# Patient Record
Sex: Female | Born: 1990 | Race: Black or African American | Hispanic: No | Marital: Married
Health system: Southern US, Community
[De-identification: ages and names within clinical notes are randomized; demographics above are authoritative.]

## PROBLEM LIST (undated history)

## (undated) ENCOUNTER — Inpatient Hospital Stay (HOSPITAL_COMMUNITY): Payer: Self-pay

## (undated) DIAGNOSIS — Z789 Other specified health status: Secondary | ICD-10-CM

## (undated) HISTORY — PX: NO PAST SURGERIES: SHX2092

---

## 2015-07-06 ENCOUNTER — Inpatient Hospital Stay (HOSPITAL_COMMUNITY): Payer: Self-pay

## 2015-07-06 ENCOUNTER — Encounter (HOSPITAL_COMMUNITY): Payer: Self-pay

## 2015-07-06 ENCOUNTER — Inpatient Hospital Stay (HOSPITAL_COMMUNITY)
Admission: AD | Admit: 2015-07-06 | Discharge: 2015-07-07 | Disposition: A | Discharge status: Home / Self Care | Payer: Self-pay | Source: Ambulatory Visit | Attending: Obstetrics and Gynecology | Admitting: Obstetrics and Gynecology

## 2015-07-06 DIAGNOSIS — Z3A15 15 weeks gestation of pregnancy: Secondary | ICD-10-CM | POA: Insufficient documentation

## 2015-07-06 DIAGNOSIS — O4692 Antepartum hemorrhage, unspecified, second trimester: Secondary | ICD-10-CM

## 2015-07-06 DIAGNOSIS — R102 Pelvic and perineal pain: Secondary | ICD-10-CM | POA: Insufficient documentation

## 2015-07-06 DIAGNOSIS — O039 Complete or unspecified spontaneous abortion without complication: Secondary | ICD-10-CM

## 2015-07-06 DIAGNOSIS — O9989 Other specified diseases and conditions complicating pregnancy, childbirth and the puerperium: Secondary | ICD-10-CM | POA: Insufficient documentation

## 2015-07-06 DIAGNOSIS — O209 Hemorrhage in early pregnancy, unspecified: Secondary | ICD-10-CM | POA: Insufficient documentation

## 2015-07-06 DIAGNOSIS — N939 Abnormal uterine and vaginal bleeding, unspecified: Secondary | ICD-10-CM

## 2015-07-06 LAB — URINE MICROSCOPIC-ADD ON: WBC UA: NONE SEEN WBC/hpf (ref 0–5)

## 2015-07-06 LAB — URINALYSIS, ROUTINE W REFLEX MICROSCOPIC
BILIRUBIN URINE: NEGATIVE
Glucose, UA: NEGATIVE mg/dL
Ketones, ur: NEGATIVE mg/dL
Leukocytes, UA: NEGATIVE
Nitrite: NEGATIVE
PH: 6.5 (ref 5.0–8.0)
Protein, ur: NEGATIVE mg/dL
SPECIFIC GRAVITY, URINE: 1.02 (ref 1.005–1.030)

## 2015-07-06 LAB — CBC
HCT: 36.6 % (ref 36.0–46.0)
Hemoglobin: 12.6 g/dL (ref 12.0–15.0)
MCH: 29.2 pg (ref 26.0–34.0)
MCHC: 34.4 g/dL (ref 30.0–36.0)
MCV: 84.9 fL (ref 78.0–100.0)
PLATELETS: 324 10*3/uL (ref 150–400)
RBC: 4.31 MIL/uL (ref 3.87–5.11)
RDW: 13.5 % (ref 11.5–15.5)
WBC: 10.8 10*3/uL — ABNORMAL HIGH (ref 4.0–10.5)

## 2015-07-06 LAB — POCT PREGNANCY, URINE: PREG TEST UR: POSITIVE — AB

## 2015-07-06 MED ORDER — IBUPROFEN 600 MG PO TABS
600.0000 mg | ORAL_TABLET | Freq: Once | ORAL | Status: AC
  Administered 2015-07-06: 600 mg via ORAL
  Filled 2015-07-06: qty 1

## 2015-07-06 MED ORDER — IBUPROFEN 600 MG PO TABS: 600.0000 mg | ORAL_TABLET | Freq: Once | ORAL | Status: DC

## 2015-07-06 NOTE — MAU Provider Note (Signed)
History   [id number]   Chief Complaint  Patient presents with  . Vaginal Bleeding    HPI Terri Miller is a 25 y.o. female  G2P1 at 15.3 wks IUP by LMP.  Arrived in [place] today from [place] and drove to [place] today.  Plans to live in [place].  Bleeding began at 1100 am this morning.  Bleeding described as heavy.  Also reports lower pelvic pain.  No other symptoms per patient.    Patient's last menstrual period was 03/20/2015.  OB History  Gravida Para Term Preterm AB SAB TAB Ectopic Multiple Living  # Outcome Date GA Lbr Len/2nd Weight Sex Delivery Anes PTL Lv  2 Current           1 Para               No past medical history on file.  No family history on file.  Social History   Social History  . Marital Status: Married    Spouse Name: N/A  . Number of Children: N/A  . Years of Education: N/A   Social History Main Topics  . Smoking status: None  . Smokeless tobacco: None  . Alcohol Use: None  . Drug Use: None  . Sexual Activity: Not Asked   Other Topics Concern  . None   Social History Narrative  . None    Allergies not on file  No current facility-administered medications on file prior to encounter.   No current outpatient prescriptions on file prior to encounter.     Review of Systems  Constitutional: Negative for fever and chills.  HENT: Negative for congestion and sore throat.   Gastrointestinal: Positive for abdominal pain. Negative for nausea, vomiting and diarrhea.  Genitourinary: Positive for vaginal bleeding and pelvic pain.  All other systems reviewed and are negative.    Physical Exam   Filed Vitals:   07/06/15 2023  BP: 111/65  Pulse: 72  Temp: 98.4 F (36.9 C)  TempSrc: Oral  Resp: 18  Height:  (1.626 m)  Weight: 40.484 kg (89 lb 4 oz)  SpO2: 97%    Physical Exam  Constitutional: She is oriented to person, place, and time. She appears well-developed and well-nourished. No  distress.  HENT:  Head: Normocephalic.  Neck: Normal range of motion. Neck supple.  Cardiovascular: Normal rate, regular rhythm and normal heart sounds.   Respiratory: Effort normal and breath sounds normal. No respiratory distress.  GI: Soft. She exhibits no mass. There is no tenderness. There is no rebound and no guarding.  Genitourinary: Uterus is enlarged ( less than 14 wks fundal height). Right adnexum displays no mass, no tenderness and no fullness. Left adnexum displays no mass, no tenderness and no fullness. There is bleeding ( moderate bleeding with clots, ? fetal parts or POC in vaginal vault) in the vagina.  Musculoskeletal: Normal range of motion.  Neurological: She is alert and oriented to person, place, and time.  Skin: Skin is warm and dry.    MAU Course  Procedures  Results for orders placed or performed during the hospital encounter of 07/06/15 (from the past 24 hour(s))  Urinalysis, Routine w reflex microscopic (not at [facility])     Status: Abnormal   Collection Time: 07/06/15  8:05 PM  Result Value Ref Range   Color, Urine YELLOW YELLOW   APPearance CLEAR CLEAR   Specific Gravity, Urine 1.020  1.005 - 1.030   pH 6.5 5.0 - 8.0   Glucose, UA NEGATIVE NEGATIVE mg/dL   Hgb urine dipstick LARGE (A) NEGATIVE   Bilirubin Urine NEGATIVE NEGATIVE   Ketones, ur NEGATIVE NEGATIVE mg/dL   Protein, ur NEGATIVE NEGATIVE mg/dL   Nitrite NEGATIVE NEGATIVE   Leukocytes, UA NEGATIVE NEGATIVE  Urine microscopic-add on     Status: Abnormal   Collection Time: 07/06/15  8:05 PM  Result Value Ref Range   Squamous Epithelial / LPF 0-5 (A) NONE SEEN   WBC, UA NONE SEEN 0 - 5 WBC/hpf   RBC / HPF 6-30 0 - 5 RBC/hpf   Bacteria, UA RARE (A) NONE SEEN   Urine-Other MUCOUS PRESENT   Pregnancy, urine POC     Status: Abnormal   Collection Time: 07/06/15  8:41 PM  Result Value Ref Range   Preg Test, Ur POSITIVE (A) NEGATIVE    MDM FINDINGS: The uterus is anteverted and heterogeneous. No  intrauterine pregnancy identified. The endometrium is thickened and contains echogenic material compatible with blood product. No abnormal endometrial vascularity noted.  Please note in presence of positive HCG levels and no documented intrauterine pregnancy on ultrasound the possibility of an ectopic pregnancy is not excluded. Correlation with clinical exam and follow-up with serial HCG levels and ultrasound recommended.  The right ovary measures 3.6 x 2.2 x 2.0 cm and the left ovary measures 3.7 x 2.3 x 2.5 cm. Both ovaries appear unremarkable.  IMPRESSION: No intrauterine pregnancy identified.  Blood clot within the endometrium.  2330 Multiple clots expelled from vaginal vault, along with what appears to be products of conception > send to pathology. Assessment and Plan  Probable Miscarriage  Plan: Discharge home Products expelled from cervix/vaginal vault sent to pathology RX ibuprofen 600 mg  Follow-up as indicated based on pathology report Reviewed bleeding precautions   Marlis Edelson, CNM 07/06/2015 11:41 PM

## 2015-07-06 NOTE — MAU Note (Signed)
Per pt's friend they arrived in [place] this am and drove to [facility] and pt has been having pain and bleeding all day. States pt is 2 months pregnant.

## 2015-07-07 LAB — HIV ANTIBODY (ROUTINE TESTING W REFLEX): HIV SCREEN 4TH GENERATION: NONREACTIVE

## 2015-07-07 LAB — HCG, QUANTITATIVE, PREGNANCY: HCG, BETA CHAIN, QUANT, S: 4297 m[IU]/mL — AB (ref <5)

## 2015-11-21 ENCOUNTER — Encounter (HOSPITAL_COMMUNITY): Payer: Self-pay

## 2015-11-21 ENCOUNTER — Inpatient Hospital Stay (HOSPITAL_COMMUNITY): Payer: Self-pay

## 2015-11-21 ENCOUNTER — Inpatient Hospital Stay (HOSPITAL_COMMUNITY)
Admission: AD | Admit: 2015-11-21 | Discharge: 2015-11-21 | Disposition: A | Discharge status: Home / Self Care | Payer: Self-pay | Source: Ambulatory Visit | Attending: Obstetrics and Gynecology | Admitting: Obstetrics and Gynecology

## 2015-11-21 ENCOUNTER — Inpatient Hospital Stay (HOSPITAL_COMMUNITY)
Admission: AD | Admit: 2015-11-21 | Discharge: 2015-11-21 | Disposition: A | Discharge status: Home / Self Care | Payer: Self-pay | Source: Ambulatory Visit | Attending: Obstetrics & Gynecology | Admitting: Obstetrics & Gynecology

## 2015-11-21 ENCOUNTER — Encounter (HOSPITAL_COMMUNITY): Payer: Self-pay | Admitting: *Deleted

## 2015-11-21 DIAGNOSIS — Z3A00 Weeks of gestation of pregnancy not specified: Secondary | ICD-10-CM | POA: Insufficient documentation

## 2015-11-21 DIAGNOSIS — R109 Unspecified abdominal pain: Secondary | ICD-10-CM | POA: Insufficient documentation

## 2015-11-21 DIAGNOSIS — O039 Complete or unspecified spontaneous abortion without complication: Secondary | ICD-10-CM | POA: Insufficient documentation

## 2015-11-21 DIAGNOSIS — O26891 Other specified pregnancy related conditions, first trimester: Secondary | ICD-10-CM | POA: Insufficient documentation

## 2015-11-21 DIAGNOSIS — O3680X0 Pregnancy with inconclusive fetal viability, not applicable or unspecified: Secondary | ICD-10-CM

## 2015-11-21 DIAGNOSIS — O209 Hemorrhage in early pregnancy, unspecified: Secondary | ICD-10-CM | POA: Insufficient documentation

## 2015-11-21 DIAGNOSIS — O4691 Antepartum hemorrhage, unspecified, first trimester: Secondary | ICD-10-CM

## 2015-11-21 HISTORY — DX: Other specified health status: Z78.9

## 2015-11-21 LAB — URINE MICROSCOPIC-ADD ON

## 2015-11-21 LAB — URINALYSIS, ROUTINE W REFLEX MICROSCOPIC
Bilirubin Urine: NEGATIVE
GLUCOSE, UA: NEGATIVE mg/dL
Ketones, ur: NEGATIVE mg/dL
Leukocytes, UA: NEGATIVE
Nitrite: NEGATIVE
Protein, ur: NEGATIVE mg/dL
SPECIFIC GRAVITY, URINE: 1.02 (ref 1.005–1.030)
pH: 5.5 (ref 5.0–8.0)

## 2015-11-21 LAB — CBC
HEMATOCRIT: 36 % (ref 36.0–46.0)
HEMATOCRIT: 33.9 % — AB (ref 36.0–46.0)
HEMOGLOBIN: 12.3 g/dL (ref 12.0–15.0)
HEMOGLOBIN: 11.7 g/dL — AB (ref 12.0–15.0)
MCH: 28.1 pg (ref 26.0–34.0)
MCH: 28.4 pg (ref 26.0–34.0)
MCHC: 34.2 g/dL (ref 30.0–36.0)
MCHC: 34.5 g/dL (ref 30.0–36.0)
MCV: 82.4 fL (ref 78.0–100.0)
MCV: 82.3 fL (ref 78.0–100.0)
PLATELETS: 298 10*3/uL (ref 150–400)
Platelets: 256 10*3/uL (ref 150–400)
RBC: 4.37 MIL/uL (ref 3.87–5.11)
RBC: 4.12 MIL/uL (ref 3.87–5.11)
RDW: 13.4 % (ref 11.5–15.5)
RDW: 13.4 % (ref 11.5–15.5)
WBC: 9 10*3/uL (ref 4.0–10.5)
WBC: 10.1 10*3/uL (ref 4.0–10.5)

## 2015-11-21 LAB — HCG, QUANTITATIVE, PREGNANCY
HCG, BETA CHAIN, QUANT, S: 5363 m[IU]/mL — AB (ref <5)
hCG, Beta Chain, Quant, S: 4159 m[IU]/mL — ABNORMAL HIGH (ref <5)

## 2015-11-21 LAB — POCT PREGNANCY, URINE: PREG TEST UR: POSITIVE — AB

## 2015-11-21 LAB — ABO/RH: ABO/RH(D): O POS

## 2015-11-21 MED ORDER — MISOPROSTOL 200 MCG PO TABS
600.0000 ug | ORAL_TABLET | Freq: Once | ORAL | Status: AC
  Administered 2015-11-21: 600 ug via BUCCAL
  Filled 2015-11-21: qty 3

## 2015-11-21 MED ORDER — OXYCODONE-ACETAMINOPHEN 5-325 MG PO TABS: 1.0000 | ORAL_TABLET | Freq: Four times a day (QID) | ORAL | Status: DC | PRN

## 2015-11-21 MED ORDER — HYDROMORPHONE HCL 1 MG/ML IJ SOLN
0.5000 mg | Freq: Once | INTRAMUSCULAR | Status: AC
  Administered 2015-11-21: 0.5 mg via INTRAMUSCULAR
  Filled 2015-11-21: qty 1

## 2015-11-21 MED ORDER — HYDROMORPHONE HCL 1 MG/ML IJ SOLN
0.5000 mg | Freq: Once | INTRAMUSCULAR | Status: AC
  Administered 2015-11-21: 0.5 mg via INTRAVENOUS
  Filled 2015-11-21: qty 1

## 2015-11-21 MED ORDER — HYDROMORPHONE HCL 1 MG/ML IJ SOLN: 1.0000 mg | Freq: Once | INTRAMUSCULAR | Status: DC

## 2015-11-21 MED ORDER — LACTATED RINGERS IV BOLUS (SEPSIS)
1000.0000 mL | Freq: Once | INTRAVENOUS | Status: AC
  Administered 2015-11-21: 1000 mL via INTRAVENOUS

## 2015-11-21 MED ORDER — PROMETHAZINE HCL 12.5 MG PO TABS: 12.5000 mg | ORAL_TABLET | ORAL | Status: DC | PRN

## 2015-11-21 NOTE — MAU Provider Note (Signed)
History     CSN: [id number]  Arrival date and time: 11/21/15 1022   First Provider Initiated Contact with Patient 11/21/15 1047        Chief Complaint  Patient presents with  . Vaginal Bleeding  . Abdominal Pain   HPI Terri Miller is a 25 y.o. G3P1011 at [redacted]w[redacted]d who presents with abdominal pain & vaginal bleeding. Was seen in MAU early this morning for same symptoms. Probable miscarriage & discharged with rx for percocet; rx not filled. States bleeding continued & increased at home; unable to stand without feeling dizzy. Denies LOC. Lower abdominal cramping worsening. Rates pain 10/10. Has not treated.   Pt speaks [place] -- pt & husband refused interpreter  OB History    Gravida Para Term Preterm AB TAB SAB Ectopic Multiple Living   3 1 1  0 1 0 1 0 0 1      Past Medical History  Diagnosis Date  . Medical history non-contributory     Past Surgical History  Procedure Laterality Date  . No past surgeries      History reviewed. No pertinent family history.  Social History  Substance Use Topics  . Smoking status: Never Smoker   . Smokeless tobacco: Never Used  . Alcohol Use: No    Allergies: No Known Allergies  Prescriptions prior to admission  Medication Sig Dispense Refill Last Dose  . ibuprofen (ADVIL,MOTRIN) 600 MG tablet Take 1 tablet (600 mg total) by mouth once. 30 tablet 0   . oxyCODONE-acetaminophen (PERCOCET/ROXICET) 5-325 MG tablet Take 1 tablet by mouth every 6 (six) hours as needed for severe pain. 12 tablet 0     Review of Systems  Constitutional: Negative for fever and chills.  Gastrointestinal: Positive for abdominal pain. Negative for nausea, vomiting, diarrhea and constipation.  Genitourinary: Negative for dysuria.       + vaginal bleeding  Neurological: Positive for weakness. Negative for dizziness.   Physical Exam   Blood pressure 109/61, pulse 60, temperature 98.2 F (36.8 C), resp. rate 18, last menstrual period 09/21/2015,  unknown if currently breastfeeding.  Physical Exam  Nursing note and vitals reviewed. Constitutional: She is oriented to person, place, and time. She appears well-developed and well-nourished. No distress.  HENT:  Head: Normocephalic and atraumatic.  Eyes: Conjunctivae are normal. Right eye exhibits no discharge. Left eye exhibits no discharge. No scleral icterus.  Neck: Normal range of motion.  Cardiovascular: Normal rate, regular rhythm and normal heart sounds.   No murmur heard. Respiratory: Effort normal and breath sounds normal. No respiratory distress. She has no wheezes.  GI: Soft. Bowel sounds are normal. She exhibits no distension. There is no tenderness. There is no rebound and no guarding.  Genitourinary: Uterus normal. There is bleeding (moderate amount of dark red blood -- lg tissue removed from cervical os (~4 cm), possible POC) in the vagina.  Cervix dilated 1 cm  Neurological: She is alert and oriented to person, place, and time.  Skin: Skin is warm and dry. She is not diaphoretic.  Psychiatric: She has a normal mood and affect. Her behavior is normal. Judgment and thought content normal.    MAU Course  Procedures Results for orders placed or performed during the hospital encounter of 11/21/15 (from the past 24 hour(s))  CBC     Status: Abnormal   Collection Time: 11/21/15 10:27 AM  Result Value Ref Range   WBC 10.1 4.0 - 10.5 K/uL   RBC 4.12 3.87 - 5.11 MIL/uL  Hemoglobin 11.7 (L) 12.0 - 15.0 g/dL   HCT 91.433.9 (L) 78.236.0 - 95.646.0 %   MCV 82.3 78.0 - 100.0 fL   MCH 28.4 26.0 - 34.0 pg   MCHC 34.5 30.0 - 36.0 g/dL   RDW 21.313.4 08.611.5 - 57.815.5 %   Platelets 298 150 - 400 K/uL  hCG, quantitative, pregnancy     Status: Abnormal   Collection Time: 11/21/15 10:28 AM  Result Value Ref Range   hCG, Beta Chain, Quant, S 4159 (H) <5 mIU/mL    [place] Ob Transvaginal  11/21/2015  CLINICAL DATA:  Persistent vaginal bleeding EXAM: TRANSVAGINAL OB ULTRASOUND TECHNIQUE: Transvaginal  ultrasound was performed for complete evaluation of the gestation as well as the maternal uterus, adnexal regions, and pelvic cul-de-sac. COMPARISON:  Study obtained earlier in the day FINDINGS: Intrauterine gestational sac: Not visualized Yolk sac:  Not visualized Embryo:  Not visualized Cardiac Activity: Not visualized Subchorionic hemorrhage:  None visualized. Maternal uterus/adnexae: There is complex material in the endometrium. In the lower uterine segment, there is focal fluid, similar to earlier in the day. No extrauterine pelvic or adnexal masses noted. There is no free pelvic fluid. IMPRESSION: Findings consistent with spontaneous abortion. There is apparent hemorrhage within the endometrium. Retained products of conception cannot be excluded sonographically given the degree of irregularity and complexity of the apparent fluid within the endometrium. There does appear to be less abnormal echogenic material in the endometrium compared to earlier in the day, consistent with the progressive bleeding since the study obtained several hours earlier. Electronically Signed   By: Bretta BangWilliam  Woodruff III M.D.   On: 11/21/2015 12:55     MDM Pt unable to stand for orthostatic VS -- IV lr bolus started CBC, BHCG, ultrasound ?POC sent to pathology Dilaudid 0.5mg  IV Ultrasound - less echogenic material than seen earlier this morning - consistent with spontaneous abortion Hemoglobin stable BHCG decreased since this morning -- 4696>[id number]>4159 Repeat orthostatic VS normal Pt reports improvement in symptoms  Vaginal bleeding decreased since exam Cytotec 600 mcg buccally prior to discharge Assessment and Plan  A: 1. Miscarriage     P: Discharge home Rx phenergan Fill rx for pain medication that was given this morning Discussed reasons to return to MAU Msg sent to clinic for f/u appt  Terri Miller 11/21/2015, 10:45 AM

## 2015-11-21 NOTE — Discharge Instructions (Signed)
Return to care   If you have heavier bleeding that soaks through more that 2 pads per hour for an hour or more  If you bleed so much that you feel like you might pass out or you do pass out  If you have significant abdominal pain that is not improved with Tylenol   If you develop a fever > 100.5       Miscarriage A miscarriage is the sudden loss of an unborn baby (fetus) before the 20th week of pregnancy. Most miscarriages happen in the first 3 months of pregnancy. Sometimes, it happens before a woman even knows she is pregnant. A miscarriage is also called a "spontaneous miscarriage" or "early pregnancy loss." Having a miscarriage can be an emotional experience. Talk with your caregiver about any questions you may have about miscarrying, the grieving process, and your future pregnancy plans. CAUSES   Problems with the fetal chromosomes that make it impossible for the baby to develop normally. Problems with the baby's genes or chromosomes are most often the result of errors that occur, by chance, as the embryo divides and grows. The problems are not inherited from the parents.  Infection of the cervix or uterus.   Hormone problems.   Problems with the cervix, such as having an incompetent cervix. This is when the tissue in the cervix is not strong enough to hold the pregnancy.   Problems with the uterus, such as an abnormally shaped uterus, uterine fibroids, or congenital abnormalities.   Certain medical conditions.   Smoking, drinking alcohol, or taking illegal drugs.   Trauma.  Often, the cause of a miscarriage is unknown.  SYMPTOMS   Vaginal bleeding or spotting, with or without cramps or pain.  Pain or cramping in the abdomen or lower back.  Passing fluid, tissue, or blood clots from the vagina. DIAGNOSIS  Your caregiver will perform a physical exam. You may also have an ultrasound to confirm the miscarriage. Blood or urine tests may also be ordered. TREATMENT     Sometimes, treatment is not necessary if you naturally pass all the fetal tissue that was in the uterus. If some of the fetus or placenta remains in the body (incomplete miscarriage), tissue left behind may become infected and must be removed. Usually, a dilation and curettage (D and C) procedure is performed. During a D and C procedure, the cervix is widened (dilated) and any remaining fetal or placental tissue is gently removed from the uterus.  Antibiotic medicines are prescribed if there is an infection. Other medicines may be given to reduce the size of the uterus (contract) if there is a lot of bleeding.  If you have Rh negative blood and your baby was Rh positive, you will need a Rh immunoglobulin shot. This shot will protect any future baby from having Rh blood problems in future pregnancies. HOME CARE INSTRUCTIONS   Your caregiver may order bed rest or may allow you to continue light activity. Resume activity as directed by your caregiver.  Have someone help with home and family responsibilities during this time.   Keep track of the number of sanitary pads you use each day and how soaked (saturated) they are. Write down this information.   Do not use tampons. Do not douche or have sexual intercourse until approved by your caregiver.   Only take over-the-counter or prescription medicines for pain or discomfort as directed by your caregiver.   Do not take aspirin. Aspirin can cause bleeding.   Keep all  follow-up appointments with your caregiver.   If you or your partner have problems with grieving, talk to your caregiver or seek counseling to help cope with the pregnancy loss. Allow enough time to grieve before trying to get pregnant again.  SEEK IMMEDIATE MEDICAL CARE IF:  5. You have severe cramps or pain in your back or abdomen. 6. You have a fever. 7. You pass large blood clots (walnut-sized or larger) ortissue from your vagina. Save any tissue for your caregiver to  inspect.  8. Your bleeding increases.  9. You have a thick, bad-smelling vaginal discharge. 10. You become lightheaded, weak, or you faint.  11. You have chills.  MAKE SURE YOU:  Understand these instructions.  Will watch your condition.  Will get help right away if you are not doing well or get worse.   This information is not intended to replace advice given to you by your health care provider. Make sure you discuss any questions you have with your health care provider.   Document Released: 11/07/2000 Document Revised: 09/08/2012 Document Reviewed: 07/03/2011 Elsevier Interactive Patient Education [employer]2016 Elsevier Inc.

## 2015-11-21 NOTE — MAU Note (Signed)
Pt presents to MAU with compliant of vaginal bleeding. Pt was evaluated this morning for a possible miscarriage. Reports heavy vaginal bleeding

## 2015-11-21 NOTE — MAU Note (Signed)
Pt reports abd pain and heavy bleeding , thinks she is pregnant but has not done a pregnancy test

## 2015-11-21 NOTE — Discharge Instructions (Signed)
Vaginal Bleeding During Pregnancy, First Trimester °A small amount of bleeding (spotting) from the vagina is common in early pregnancy. Sometimes the bleeding is normal and is not a problem, and sometimes it is a sign of something serious. Be sure to tell your doctor about any bleeding from your vagina right away. °HOME CARE °· Watch your condition for any changes. °· Follow your doctor's instructions about how active you can be. °· If you are on bed rest: °¨ You may need to stay in bed and only get up to use the bathroom. °¨ You may be allowed to do some activities. °¨ If you need help, make plans for someone to help you. °· Write down: °¨ The number of pads you use each day. °¨ How often you change pads. °¨ How soaked (saturated) your pads are. °· Do not use tampons. °· Do not douche. °· Do not have sex or orgasms until your doctor says it is okay. °· If you pass any tissue from your vagina, save the tissue so you can show it to your doctor. °· Only take medicines as told by your doctor. °· Do not take aspirin because it can make you bleed. °· Keep all follow-up visits as told by your doctor. °GET HELP IF:  °· You bleed from your vagina. °· You have cramps. °· You have labor pains. °· You have a fever that does not go away after you take medicine. °GET HELP RIGHT AWAY IF:  °· You have very bad cramps in your back or belly (abdomen). °· You pass large clots or tissue from your vagina. °· You bleed more. °· You feel light-headed or weak. °· You pass out (faint). °· You have chills. °· You are leaking fluid or have a gush of fluid from your vagina. °· You pass out while pooping (having a bowel movement). °MAKE SURE YOU: °· Understand these instructions. °· Will watch your condition. °· Will get help right away if you are not doing well or get worse. °  °This information is not intended to replace advice given to you by your health care provider. Make sure you discuss any questions you have with your health care  provider. °  °Document Released: 09/28/2013 Document Reviewed: 09/28/2013 °Elsevier Interactive Patient Education ©2016 Elsevier Inc. ° °Pelvic Rest °Pelvic rest is sometimes recommended for women when:  °· The placenta is partially or completely covering the opening of the cervix (placenta previa). °· There is bleeding between the uterine wall and the amniotic sac in the first trimester (subchorionic hemorrhage). °· The cervix begins to open without labor starting (incompetent cervix, cervical insufficiency). °· The labor is too early (preterm labor). °HOME CARE INSTRUCTIONS °· Do not have sexual intercourse, stimulation, or an orgasm. °· Do not use tampons, douche, or put anything in the vagina. °· Do not lift anything over 10 pounds (4.5 kg). °· Avoid strenuous activity or straining your pelvic muscles. °SEEK MEDICAL CARE IF:  °· You have any vaginal bleeding during pregnancy. Treat this as a potential emergency. °· You have cramping pain felt low in the stomach (stronger than menstrual cramps). °· You notice vaginal discharge (watery, mucus, or bloody). °· You have a low, dull backache. °· There are regular contractions or uterine tightening. °SEEK IMMEDIATE MEDICAL CARE IF: °You have vaginal bleeding and have placenta previa.  °  °This information is not intended to replace advice given to you by your health care provider. Make sure you discuss any questions you have with   your health care provider. °  °Document Released: 09/08/2010 Document Revised: 08/06/2011 Document Reviewed: 11/15/2014 °Elsevier Interactive Patient Education ©2016 Elsevier Inc. ° °

## 2015-11-21 NOTE — MAU Provider Note (Signed)
History     CSN: [id number]  Arrival date and time: 11/21/15 [id number]   First Provider Initiated Contact with Patient 11/21/15 0344      Chief Complaint  Patient presents with  . Abdominal Pain  . Vaginal Bleeding   HPI Ms. Terri Miller is a 25 y.o. G3P1 at Unknown who presents to MAU today with complaint of abdominal pain and vaginal bleeding that started this morning. The patient is unsure of LMP, but states approximately 2 months ago. She states pain is severe in the lower abdomen. She states bleeding has only been a small amount. She has not taken anything for pain. She denies fever. History taken with help of her husband, he states that patient understands and speaks [place], but is in pain and doesn't want to talk. The patient had a miscarriage in February and did not follow-up with MD afterwards. She does states that normal periods resumed after SAB.    OB History    Gravida Para Term Preterm AB TAB SAB Ectopic Multiple Living   3 1 1  0 1 0 1 0 0 1      History reviewed. No pertinent past medical history.  History reviewed. No pertinent past surgical history.  History reviewed. No pertinent family history.  Social History  Substance Use Topics  . Smoking status: Never Smoker   . Smokeless tobacco: Never Used  . Alcohol Use: No    Allergies: No Known Allergies  Prescriptions prior to admission  Medication Sig Dispense Refill Last Dose  . ibuprofen (ADVIL,MOTRIN) 600 MG tablet Take 1 tablet (600 mg total) by mouth once. 30 tablet 0   . NON FORMULARY amoxicilline tm 500 mg       Review of Systems  Constitutional: Negative for fever.  Gastrointestinal: Positive for abdominal pain.  Genitourinary:       + vaginal bleeding   Physical Exam   Blood pressure 97/62, pulse 78, temperature 98.2 F (36.8 C), temperature source Oral, resp. rate 17, last menstrual period 03/20/2015, SpO2 100 %, unknown if currently breastfeeding.  Physical Exam  Nursing note  and vitals reviewed. Constitutional: She is oriented to person, place, and time. She appears well-developed and well-nourished. No distress.  HENT:  Head: Normocephalic and atraumatic.  Cardiovascular: Normal rate.   Respiratory: Effort normal.  GI: Soft. She exhibits no distension and no mass. There is no tenderness. There is no rebound and no guarding.  Genitourinary: There is bleeding (moderate blood with multiple small clots and possible POC removed from the vagina, bleeding is minimal after initial evacuation) in the vagina. No vaginal discharge found.  Neurological: She is alert and oriented to person, place, and time.  Skin: Skin is warm and dry. No erythema.  Psychiatric: She has a normal mood and affect.    Results for orders placed or performed during the hospital encounter of 11/21/15 (from the past 24 hour(s))  Urinalysis, Routine w reflex microscopic (not [facility])     Status: Abnormal   Collection Time: 11/21/15  3:17 AM  Result Value Ref Range   Color, Urine YELLOW YELLOW   APPearance CLOUDY (A) CLEAR   Specific Gravity, Urine 1.020 1.005 - 1.030   pH 5.5 5.0 - 8.0   Glucose, UA NEGATIVE NEGATIVE mg/dL   Hgb urine dipstick LARGE (A) NEGATIVE   Bilirubin Urine NEGATIVE NEGATIVE   Ketones, ur NEGATIVE NEGATIVE mg/dL   Protein, ur NEGATIVE NEGATIVE mg/dL   Nitrite NEGATIVE NEGATIVE   Leukocytes, UA NEGATIVE NEGATIVE  Urine microscopic-add on     Status: Abnormal   Collection Time: 11/21/15  3:17 AM  Result Value Ref Range   Squamous Epithelial / LPF 0-5 (A) NONE SEEN   WBC, UA 0-5 0 - 5 WBC/hpf   RBC / HPF 6-30 0 - 5 RBC/hpf   Bacteria, UA FEW (A) NONE SEEN  Pregnancy, urine POC     Status: Abnormal   Collection Time: 11/21/15  3:35 AM  Result Value Ref Range   Preg Test, Ur POSITIVE (A) NEGATIVE  CBC     Status: None   Collection Time: 11/21/15  3:56 AM  Result Value Ref Range   WBC 9.0 4.0 - 10.5 K/uL   RBC 4.37 3.87 - 5.11 MIL/uL   Hemoglobin 12.3 12.0 - 15.0  g/dL   HCT 16.136.0 09.636.0 - 04.546.0 %   MCV 82.4 78.0 - 100.0 fL   MCH 28.1 26.0 - 34.0 pg   MCHC 34.2 30.0 - 36.0 g/dL   RDW 40.913.4 81.111.5 - 91.415.5 %   Platelets 256 150 - 400 K/uL  ABO/Rh     Status: None (Preliminary result)   Collection Time: 11/21/15  3:56 AM  Result Value Ref Range   ABO/RH(D) O POS   hCG, quantitative, pregnancy     Status: Abnormal   Collection Time: 11/21/15  3:56 AM  Result Value Ref Range   hCG, Beta Chain, Quant, S 5363 (H) <5 mIU/mL   [place] Ob Comp Less 14 Wks  11/21/2015  CLINICAL DATA:  Pain and bleeding EXAM: OBSTETRIC <14 WK US AND TRANSVAGINAL OB US TECHNIQUE: Both transabdominal and transvaginal ultrasound examinations were performed for complete evaluation of the gestation as well as the maternal uterus, adnexal regions, and pelvic cul-de-sac. Transvaginal technique was performed to assess early pregnancy. COMPARISON:  None. FINDINGS: Intrauterine gestational sac: None Yolk sac:  No Embryo:  No Cardiac Activity: No Heart Rate:   bpm MSD:   mm    w     d CRL:    mm    w    d                  US EDC: Subchorionic hemorrhage: Large volume of complex echogenic material in the vagina, probably blood. Similar material is present in the lower uterine segment endometrial canal. Maternal uterus/adnexae: Both ovaries are normal. No abnormal pelvic fluid collections. IMPRESSION: No gestation sac is visible. Large volume of blood in the lower uterine segment and vagina. Serial HCG and follow-up ultrasound recommended. Electronically Signed   By: Ellery Plunkaniel R Mitchell M.D.   On: 11/21/2015 04:58   [place] Ob Transvaginal  11/21/2015  CLINICAL DATA:  Pain and bleeding EXAM: OBSTETRIC <14 WK US AND TRANSVAGINAL OB US TECHNIQUE: Both transabdominal and transvaginal ultrasound examinations were performed for complete evaluation of the gestation as well as the maternal uterus, adnexal regions, and pelvic cul-de-sac. Transvaginal technique was performed to assess early pregnancy. COMPARISON:  None.  FINDINGS: Intrauterine gestational sac: None Yolk sac:  No Embryo:  No Cardiac Activity: No Heart Rate:   bpm MSD:   mm    w     d CRL:    mm    w    d                  US EDC: Subchorionic hemorrhage: Large volume of complex echogenic material in the vagina, probably blood. Similar material is present in the lower uterine segment endometrial canal. Maternal uterus/adnexae: Both  ovaries are normal. No abnormal pelvic fluid collections. IMPRESSION: No gestation sac is visible. Large volume of blood in the lower uterine segment and vagina. Serial HCG and follow-up ultrasound recommended. Electronically Signed   By: Ellery Plunk M.D.   On: 11/21/2015 04:58    MAU Course  Procedures None  MDM +UPT UA, CBC, quant hCG, ABO/Rh and [place] today  US performed at bedside due to patient discomfort and language barrier 0.5 mg Dilaudid given for pain Patient given additional 0.5 mg IM Dilaudid for pain prior to pelvic exam Multiple small clots and possible POC (none formed) removed from the vagina and sent to pathology Patient's bleeding monitored ~ 30 minutes after speculum exam. Patient is stable for discharge. Bleeding is light on pad. Patient reports improvement in pain.  Discussed high likelihood of SAB and need for follow-up in 48 hour to confirm.  Assessment and Plan  A: Vaginal bleeding in pregnancy, first trimester  P: Discharge home Rx for Percocet given to patient  Bleeding/ectopic precautions discussed Patient advised to follow-up with [facility] on Wednesday at 11:00 am for repeat labs Patient may return to MAU as needed or if her condition were to change or worsen   Marny Lowenstein, PA-C  11/21/2015, 5:57 AM

## 2015-12-28 ENCOUNTER — Ambulatory Visit (INDEPENDENT_AMBULATORY_CARE_PROVIDER_SITE_OTHER): Payer: Self-pay | Admitting: Obstetrics and Gynecology

## 2015-12-28 ENCOUNTER — Encounter: Payer: Self-pay | Admitting: Obstetrics and Gynecology

## 2015-12-28 VITALS — BP 104/53 | HR 67 | Wt 93.1 lb

## 2015-12-28 DIAGNOSIS — O039 Complete or unspecified spontaneous abortion without complication: Secondary | ICD-10-CM

## 2015-12-28 LAB — HCG, QUANTITATIVE, PREGNANCY: hCG, Beta Chain, Quant, S: <2 m[IU]/mL

## 2015-12-28 NOTE — Progress Notes (Signed)
HPI:    Pt presents today for follow up of 1st trimester spontaneous abortion. She was seen in the MAU 2 times on 6/26. Pt she had bHCG that decreased from 5000-4000 that day. Pt states she had another blood draw since then but I am unable to find the result. She reports no bleeding and denies and further complications. She does state she would like to get pregnant again and is not interested in birth control.   Review of Systems  Constitutional: Negative for chills, fever and weight loss.  HENT: Negative for tinnitus.   Eyes: Positive for blurred vision. Negative for double vision.  Respiratory: Positive for cough. Negative for hemoptysis.   Cardiovascular: Negative for chest pain and palpitations.  Gastrointestinal: Negative for abdominal pain, heartburn, nausea and vomiting.  Genitourinary: Negative for dysuria and urgency.  Musculoskeletal: Negative for myalgias.  Skin: Negative for itching and rash.  Neurological: Negative for dizziness, focal weakness and headaches.  Endo/Heme/Allergies: Positive for environmental allergies. Bruises/bleeds easily.  Psychiatric/Behavioral: Negative for depression and substance abuse.   Physical Exam  Constitutional: She is oriented to person, place, and time. She appears well-developed and well-nourished.  HENT:  Head: Normocephalic.  Eyes: EOM are normal. Pupils are equal, round, and reactive to light.  Pulmonary/Chest: Effort normal and breath sounds normal.  Abdominal: Soft. She exhibits distension. There is tenderness.  Musculoskeletal: Normal range of motion.  Neurological: She is alert and oriented to person, place, and time.    A/P Spontaneous abortion - Plan: B-HCG Quant  Pt with spontaneous abortion likley completed with no more bleeding. Will check B-hcg to make sure has gone to 0 or close. Offered birth control counseled to not try to get pregnancy for approximately 3 months after spontaneous abortion.

## 2015-12-28 NOTE — Patient Instructions (Signed)
Miscarriage  A miscarriage is the sudden loss of an unborn baby (fetus) before the 20th week of pregnancy. Most miscarriages happen in the first 3 months of pregnancy. Sometimes, it happens before a woman even knows she is pregnant. A miscarriage is also called a "spontaneous miscarriage" or "early pregnancy loss." Having a miscarriage can be an emotional experience. Talk with your caregiver about any questions you may have about miscarrying, the grieving process, and your future pregnancy plans.  CAUSES    Problems with the fetal chromosomes that make it impossible for the baby to develop normally. Problems with the baby's genes or chromosomes are most often the result of errors that occur, by chance, as the embryo divides and grows. The problems are not inherited from the parents.   Infection of the cervix or uterus.    Hormone problems.    Problems with the cervix, such as having an incompetent cervix. This is when the tissue in the cervix is not strong enough to hold the pregnancy.    Problems with the uterus, such as an abnormally shaped uterus, uterine fibroids, or congenital abnormalities.    Certain medical conditions.    Smoking, drinking alcohol, or taking illegal drugs.    Trauma.   Often, the cause of a miscarriage is unknown.   SYMPTOMS    Vaginal bleeding or spotting, with or without cramps or pain.   Pain or cramping in the abdomen or lower back.   Passing fluid, tissue, or blood clots from the vagina.  DIAGNOSIS   Your caregiver will perform a physical exam. You may also have an ultrasound to confirm the miscarriage. Blood or urine tests may also be ordered.  TREATMENT    Sometimes, treatment is not necessary if you naturally pass all the fetal tissue that was in the uterus. If some of the fetus or placenta remains in the body (incomplete miscarriage), tissue left behind may become infected and must be removed. Usually, a dilation and curettage (D and C) procedure is performed.  During a D and C procedure, the cervix is widened (dilated) and any remaining fetal or placental tissue is gently removed from the uterus.   Antibiotic medicines are prescribed if there is an infection. Other medicines may be given to reduce the size of the uterus (contract) if there is a lot of bleeding.   If you have Rh negative blood and your baby was Rh positive, you will need a Rh immunoglobulin shot. This shot will protect any future baby from having Rh blood problems in future pregnancies.  HOME CARE INSTRUCTIONS    Your caregiver may order bed rest or may allow you to continue light activity. Resume activity as directed by your caregiver.   Have someone help with home and family responsibilities during this time.    Keep track of the number of sanitary pads you use each day and how soaked (saturated) they are. Write down this information.    Do not use tampons. Do not douche or have sexual intercourse until approved by your caregiver.    Only take over-the-counter or prescription medicines for pain or discomfort as directed by your caregiver.    Do not take aspirin. Aspirin can cause bleeding.    Keep all follow-up appointments with your caregiver.    If you or your partner have problems with grieving, talk to your caregiver or seek counseling to help cope with the pregnancy loss. Allow enough time to grieve before trying to get pregnant again.     SEEK IMMEDIATE MEDICAL CARE IF:    You have severe cramps or pain in your back or abdomen.   You have a fever.   You pass large blood clots (walnut-sized or larger) ortissue from your vagina. Save any tissue for your caregiver to inspect.    Your bleeding increases.    You have a thick, bad-smelling vaginal discharge.   You become lightheaded, weak, or you faint.    You have chills.   MAKE SURE YOU:   Understand these instructions.   Will watch your condition.   Will get help right away if you are not doing well or get worse.     This  information is not intended to replace advice given to you by your health care provider. Make sure you discuss any questions you have with your health care provider.     Document Released: 11/07/2000 Document Revised: 09/08/2012 Document Reviewed: 07/03/2011  Elsevier Interactive Patient Education 2016 [employer]vier Inc[employer]

## 2016-05-28 NOTE — L&D Delivery Note (Signed)
Patient is 26 y.o. [id number] 3420w0d admitted in active labor  Delivery Note At 10:50 AM a viable female was delivered via Vaginal, Spontaneous Delivery (Presentation: ROA ).  APGAR: 9, 9; weight  pending.   Placenta status: Intact.  Cord: 3v with the following complications: none.  Cord pH: N/A  Anesthesia:   Episiotomy: None Lacerations: None Suture Repair: N/A Est. Blood Loss (mL): 400  Mom to postpartum.  Baby to Couplet care / Skin to Skin.  Jazma Phelps,DO 12/12/2016, 11:58 AM OB Fellow

## 2016-09-25 ENCOUNTER — Encounter (HOSPITAL_COMMUNITY): Payer: Self-pay

## 2016-11-05 LAB — OB RESULTS CONSOLE ABO/RH: RH Type: POSITIVE

## 2016-11-05 LAB — OB RESULTS CONSOLE ANTIBODY SCREEN: ANTIBODY SCREEN: NEGATIVE

## 2016-11-05 LAB — OB RESULTS CONSOLE RPR: RPR: NONREACTIVE

## 2016-11-05 LAB — OB RESULTS CONSOLE HEPATITIS B SURFACE ANTIGEN: HEP B S AG: NEGATIVE

## 2016-11-05 LAB — OB RESULTS CONSOLE RUBELLA ANTIBODY, IGM: Rubella: IMMUNE

## 2016-11-05 LAB — OB RESULTS CONSOLE GC/CHLAMYDIA
Chlamydia: NEGATIVE
GC PROBE AMP, GENITAL: NEGATIVE

## 2016-11-05 LAB — OB RESULTS CONSOLE HIV ANTIBODY (ROUTINE TESTING): HIV: NONREACTIVE

## 2016-11-19 LAB — OB RESULTS CONSOLE GBS: STREP GROUP B AG: NEGATIVE

## 2016-12-05 ENCOUNTER — Inpatient Hospital Stay (HOSPITAL_COMMUNITY)
Admission: AD | Admit: 2016-12-05 | Discharge: 2016-12-05 | Disposition: A | Discharge status: Home / Self Care | Payer: Self-pay | Source: Ambulatory Visit | Attending: Family Medicine | Admitting: Family Medicine

## 2016-12-05 ENCOUNTER — Encounter (HOSPITAL_COMMUNITY): Payer: Self-pay

## 2016-12-05 DIAGNOSIS — Z3A40 40 weeks gestation of pregnancy: Secondary | ICD-10-CM | POA: Insufficient documentation

## 2016-12-05 DIAGNOSIS — O471 False labor at or after 37 completed weeks of gestation: Secondary | ICD-10-CM | POA: Insufficient documentation

## 2016-12-05 DIAGNOSIS — O479 False labor, unspecified: Secondary | ICD-10-CM

## 2016-12-05 NOTE — MAU Note (Signed)
VRI used to triage patient. Rubin Payordith [phone], after triage complete, pt. Desires husband to interpret for her, form signed.  [facility] EDC 12/05/2016, per patient.

## 2016-12-05 NOTE — Discharge Instructions (Signed)

## 2016-12-05 NOTE — MAU Note (Signed)
I have communicated with Dr. Mosetta PuttFeng and reviewed vital signs:  Vitals:   12/05/16 0600 12/05/16 0716  BP: 108/66 (!) 98/56  Pulse: 90 96  Resp: 17 17  Temp: 98.4 F (36.9 C)     Vaginal exam:  Dilation: Closed Effacement (%): Thick Cervical Position: Posterior Exam by:: Latricia HeftAnna Season Astacio, RN,   Also reviewed contraction pattern and that non-stress test is reactive.  It has been documented that patient is contracting every irregularly with no cervical change not indicating active labor.  Patient denies any other complaints.  Based on this report provider has given order for discharge.  A discharge order and diagnosis entered by a provider.   Labor discharge instructions reviewed with patient.

## 2016-12-07 ENCOUNTER — Other Ambulatory Visit: Payer: Self-pay | Admitting: Advanced Practice Midwife

## 2016-12-11 ENCOUNTER — Encounter (HOSPITAL_COMMUNITY): Payer: Self-pay | Admitting: *Deleted

## 2016-12-11 ENCOUNTER — Telehealth (HOSPITAL_COMMUNITY): Payer: Self-pay | Admitting: *Deleted

## 2016-12-11 NOTE — Telephone Encounter (Signed)
Preadmission screen Interpreter number [phone]

## 2016-12-12 ENCOUNTER — Inpatient Hospital Stay (HOSPITAL_COMMUNITY)
Admission: AD | Admit: 2016-12-12 | Discharge: 2016-12-14 | DRG: 775 | Disposition: A | Discharge status: Home / Self Care | Payer: Medicaid Other | Source: Ambulatory Visit | Attending: Family Medicine | Admitting: Family Medicine

## 2016-12-12 ENCOUNTER — Encounter (HOSPITAL_COMMUNITY): Payer: Self-pay | Admitting: Anesthesiology

## 2016-12-12 ENCOUNTER — Inpatient Hospital Stay (HOSPITAL_COMMUNITY): Admission: RE | Admit: 2016-12-12 | Payer: Self-pay | Source: Ambulatory Visit

## 2016-12-12 ENCOUNTER — Encounter (HOSPITAL_COMMUNITY): Payer: Self-pay | Admitting: *Deleted

## 2016-12-12 DIAGNOSIS — Z3A41 41 weeks gestation of pregnancy: Secondary | ICD-10-CM

## 2016-12-12 DIAGNOSIS — O480 Post-term pregnancy: Secondary | ICD-10-CM | POA: Diagnosis present

## 2016-12-12 LAB — CBC
HCT: 34.4 % — ABNORMAL LOW (ref 36.0–46.0)
Hemoglobin: 11.4 g/dL — ABNORMAL LOW (ref 12.0–15.0)
MCH: 28.3 pg (ref 26.0–34.0)
MCHC: 33.1 g/dL (ref 30.0–36.0)
MCV: 85.4 fL (ref 78.0–100.0)
PLATELETS: 218 10*3/uL (ref 150–400)
RBC: 4.03 MIL/uL (ref 3.87–5.11)
RDW: 19.3 % — ABNORMAL HIGH (ref 11.5–15.5)
WBC: 8.3 10*3/uL (ref 4.0–10.5)

## 2016-12-12 LAB — TYPE AND SCREEN
ABO/RH(D): O POS
ANTIBODY SCREEN: NEGATIVE

## 2016-12-12 MED ORDER — OXYTOCIN 40 UNITS IN LACTATED RINGERS INFUSION - SIMPLE MED
2.5000 [IU]/h | INTRAVENOUS | Status: DC
  Filled 2016-12-12: qty 1000

## 2016-12-12 MED ORDER — WITCH HAZEL-GLYCERIN EX PADS: 1.0000 "application " | MEDICATED_PAD | CUTANEOUS | Status: DC | PRN

## 2016-12-12 MED ORDER — TETANUS-DIPHTH-ACELL PERTUSSIS 5-2.5-18.5 LF-MCG/0.5 IM SUSP: 0.5000 mL | Freq: Once | INTRAMUSCULAR | Status: DC

## 2016-12-12 MED ORDER — COCONUT OIL OIL: 1.0000 "application " | TOPICAL_OIL | Status: DC | PRN

## 2016-12-12 MED ORDER — SENNOSIDES-DOCUSATE SODIUM 8.6-50 MG PO TABS
2.0000 | ORAL_TABLET | ORAL | Status: DC
  Administered 2016-12-13 (×2): 2 via ORAL
  Filled 2016-12-12 (×2): qty 2

## 2016-12-12 MED ORDER — ACETAMINOPHEN 325 MG PO TABS: 650.0000 mg | ORAL_TABLET | ORAL | Status: DC | PRN

## 2016-12-12 MED ORDER — OXYCODONE-ACETAMINOPHEN 5-325 MG PO TABS: 2.0000 | ORAL_TABLET | ORAL | Status: DC | PRN

## 2016-12-12 MED ORDER — SIMETHICONE 80 MG PO CHEW: 80.0000 mg | CHEWABLE_TABLET | ORAL | Status: DC | PRN

## 2016-12-12 MED ORDER — DIBUCAINE 1 % RE OINT: 1.0000 "application " | TOPICAL_OINTMENT | RECTAL | Status: DC | PRN

## 2016-12-12 MED ORDER — PHENYLEPHRINE 40 MCG/ML (10ML) SYRINGE FOR IV PUSH (FOR BLOOD PRESSURE SUPPORT)
PREFILLED_SYRINGE | INTRAVENOUS | Status: AC
  Filled 2016-12-12: qty 20

## 2016-12-12 MED ORDER — PRENATAL MULTIVITAMIN CH
1.0000 | ORAL_TABLET | Freq: Every day | ORAL | Status: DC
  Administered 2016-12-14: 1 via ORAL
  Filled 2016-12-12: qty 1

## 2016-12-12 MED ORDER — FENTANYL CITRATE (PF) 100 MCG/2ML IJ SOLN
100.0000 ug | INTRAMUSCULAR | Status: DC | PRN
  Administered 2016-12-12 (×5): 100 ug via INTRAVENOUS
  Filled 2016-12-12 (×5): qty 2

## 2016-12-12 MED ORDER — EPHEDRINE 5 MG/ML INJ
10.0000 mg | INTRAVENOUS | Status: DC | PRN
  Filled 2016-12-12: qty 2

## 2016-12-12 MED ORDER — OXYCODONE-ACETAMINOPHEN 5-325 MG PO TABS: 1.0000 | ORAL_TABLET | ORAL | Status: DC | PRN

## 2016-12-12 MED ORDER — LACTATED RINGERS IV SOLN
INTRAVENOUS | Status: DC
  Administered 2016-12-12: 04:00:00 via INTRAVENOUS

## 2016-12-12 MED ORDER — ZOLPIDEM TARTRATE 5 MG PO TABS: 5.0000 mg | ORAL_TABLET | Freq: Every evening | ORAL | Status: DC | PRN

## 2016-12-12 MED ORDER — FENTANYL 2.5 MCG/ML BUPIVACAINE 1/10 % EPIDURAL INFUSION (WH - ANES)
INTRAMUSCULAR | Status: AC
  Filled 2016-12-12: qty 100

## 2016-12-12 MED ORDER — IBUPROFEN 600 MG PO TABS
600.0000 mg | ORAL_TABLET | Freq: Four times a day (QID) | ORAL | Status: DC
  Administered 2016-12-12 – 2016-12-14 (×7): 600 mg via ORAL
  Filled 2016-12-12 (×8): qty 1

## 2016-12-12 MED ORDER — PHENYLEPHRINE 40 MCG/ML (10ML) SYRINGE FOR IV PUSH (FOR BLOOD PRESSURE SUPPORT)
80.0000 ug | PREFILLED_SYRINGE | INTRAVENOUS | Status: DC | PRN
  Filled 2016-12-12: qty 5

## 2016-12-12 MED ORDER — DIPHENHYDRAMINE HCL 25 MG PO CAPS: 25.0000 mg | ORAL_CAPSULE | Freq: Four times a day (QID) | ORAL | Status: DC | PRN

## 2016-12-12 MED ORDER — ONDANSETRON HCL 4 MG PO TABS: 4.0000 mg | ORAL_TABLET | ORAL | Status: DC | PRN

## 2016-12-12 MED ORDER — BENZOCAINE-MENTHOL 20-0.5 % EX AERO: 1.0000 "application " | INHALATION_SPRAY | CUTANEOUS | Status: DC | PRN

## 2016-12-12 MED ORDER — OXYTOCIN BOLUS FROM INFUSION
500.0000 mL | Freq: Once | INTRAVENOUS | Status: AC
  Administered 2016-12-12: 500 mL via INTRAVENOUS

## 2016-12-12 MED ORDER — MISOPROSTOL 25 MCG QUARTER TABLET
25.0000 ug | ORAL_TABLET | ORAL | Status: DC | PRN
  Filled 2016-12-12: qty 1

## 2016-12-12 MED ORDER — ONDANSETRON HCL 4 MG/2ML IJ SOLN: 4.0000 mg | INTRAMUSCULAR | Status: DC | PRN

## 2016-12-12 MED ORDER — FENTANYL 2.5 MCG/ML BUPIVACAINE 1/10 % EPIDURAL INFUSION (WH - ANES)
14.0000 mL/h | INTRAMUSCULAR | Status: DC | PRN
Start: 2016-12-12 — End: 2016-12-12

## 2016-12-12 MED ORDER — LACTATED RINGERS IV SOLN: 500.0000 mL | INTRAVENOUS | Status: DC | PRN

## 2016-12-12 MED ORDER — LIDOCAINE HCL (PF) 1 % IJ SOLN
30.0000 mL | INTRAMUSCULAR | Status: DC | PRN
  Filled 2016-12-12: qty 30

## 2016-12-12 MED ORDER — DIPHENHYDRAMINE HCL 50 MG/ML IJ SOLN: 12.5000 mg | INTRAMUSCULAR | Status: DC | PRN

## 2016-12-12 MED ORDER — SOD CITRATE-CITRIC ACID 500-334 MG/5ML PO SOLN: 30.0000 mL | ORAL | Status: DC | PRN

## 2016-12-12 MED ORDER — LACTATED RINGERS IV SOLN
500.0000 mL | Freq: Once | INTRAVENOUS | Status: AC
  Administered 2016-12-12: 500 mL via INTRAVENOUS

## 2016-12-12 MED ORDER — ONDANSETRON HCL 4 MG/2ML IJ SOLN: 4.0000 mg | Freq: Four times a day (QID) | INTRAMUSCULAR | Status: DC | PRN

## 2016-12-12 MED ORDER — TERBUTALINE SULFATE 1 MG/ML IJ SOLN
0.2500 mg | Freq: Once | INTRAMUSCULAR | Status: DC | PRN
  Filled 2016-12-12: qty 1

## 2016-12-12 NOTE — Progress Notes (Signed)
Patient ID: Terri Miller, female   DOB: 12/22/1990, 26 y.o.   MRN: [id number] Terri Miller is a 26 y.o. G3P1011 at 3319w0d admitted for active labor  Subjective: Doing well  Objective: BP 113/63   Pulse 76   Temp 98 F (36.7 C) (Oral)   Resp 16   Ht 5\' 5"  (1.651 m)   Wt 53.5 kg (118 lb)   BMI 19.64 kg/m  No intake/output data recorded.  FHT:  FHR: 125 bpm, variability: moderate,  accelerations:  Present,  decelerations:  Absent UC:   q 2-565mins  SVE:   Dilation: 6 Effacement (%): 90 Station: 0 Exam by:: FiservCatie Sullivan RN  Labs: Lab Results  Component Value Date   WBC 8.3 12/12/2016   HGB 11.4 (L) 12/12/2016   HCT 34.4 (L) 12/12/2016   MCV 85.4 12/12/2016   PLT 218 12/12/2016    Assessment / Plan: Spontaneous labor, progressing normally  Labor: Progressing normally Fetal Wellbeing:  Category I Pain Control:  Labor support without medications Pre-eclampsia: n/a I/D:  n/a Anticipated MOD:  NSVD  Marge DuncansBooker, Kimberly Randall CNM, WHNP-BC 12/12/2016, 8:01 AM

## 2016-12-12 NOTE — Lactation Note (Signed)
This note was copied from a baby's chart. Lactation Consultation Note  Patient Name: Terri Denton BrickBalkissa Ousseini Bizo FAOZH'YToday's Date: 12/12/2016 Reason for consult: Initial assessment Baby at 8 hr of life. Upon entry baby was sleeping in Mom's arms. Experienced Mom reports baby is latching well. She denies breast or nipple pain, voiced no concerns. She stated she can manually express, has seen colostrum, and has a spoon at the bedside. Discussed baby behavior, feeding frequency, baby belly size, voids, wt loss, breast changes, and nipple care. Given lactation handouts. Aware of OP services and support group. Mom will call at the next bf for lactation to view a latch.      Maternal Data Has patient been taught Hand Expression?: Yes Does the patient have breastfeeding experience prior to this delivery?: Yes  Feeding Feeding Type: Breast Milk Length of feed: 3 min  LATCH Score/Interventions                      Lactation Tools Discussed/Used     Consult Status Consult Status: Follow-up Date: 12/13/16 Follow-up type: In-patient    Terri Miller 12/12/2016, 7:02 PM

## 2016-12-12 NOTE — Progress Notes (Signed)
Patient ID: Terri Miller, female   DOB: 03/12/1991, 26 y.o.   MRN: [id number] Terri Miller is a 26 y.o. G3P1011 at 2470w0d admitted for active labor  Subjective: Doing well. IV pain medications. Feeling some pressure  Objective: BP 118/66   Pulse 84   Temp 98.1 F (36.7 C) (Oral)   Resp 16   Ht 5\' 5"  (1.651 m)   Wt 118 lb (53.5 kg)   BMI 19.64 kg/m  No intake/output data recorded.  FHT:  FHR: 125 bpm, variability: moderate,  accelerations:  Present,  decelerations:  Variables UC:   q 2-763mins  SVE:   Dilation: Lip/rim Effacement (%): 100 Station: 0, +1 Exam by:: Dr Doroteo Glassmanphelps  AROM: clear   Labs: Lab Results  Component Value Date   WBC 8.3 12/12/2016   HGB 11.4 (L) 12/12/2016   HCT 34.4 (L) 12/12/2016   MCV 85.4 12/12/2016   PLT 218 12/12/2016    Assessment / Plan: Spontaneous labor, progressing normally  Labor: Progressing normally, AROM, expectant management Fetal Wellbeing: Category II Pain Control:  IV pain meds Pre-eclampsia: n/a I/D:  n/a Anticipated MOD:  NSVD  Caryl AdaJazma Javon Snee, DO 12/12/2016, 9:22 AM

## 2016-12-12 NOTE — H&P (Signed)
LABOR ADMISSION HISTORY AND PHYSICAL  Terri Miller is a 26 y.o. female G3P1011 with IUP at [redacted]w[redacted]d by [place] presenting for SOL. Patient was sleeping, her partner reports +FMs, No LOF, no VB, no blurry vision, no headaches, no peripheral edema, and no RUQ pain.  She plans on breast feeding. She is undecided for birth control.  Dating: By [place] --->  Estimated Date of Delivery: 12/05/16   Prenatal History/Complications:  Past Medical History: Past Medical History:  Diagnosis Date  . Medical history non-contributory     Past Surgical History: Past Surgical History:  Procedure Laterality Date  . NO PAST SURGERIES      Obstetrical History: OB History    Gravida Para Term Preterm AB Living   3 1 1  0 1 1   SAB TAB Ectopic Multiple Live Births   1 0 0 0        Social History: Social History   Social History  . Marital status: Married    Spouse name: N/A  . Number of children: N/A  . Years of education: N/A   Social History Main Topics  . Smoking status: Never Smoker  . Smokeless tobacco: Never Used  . Alcohol use No  . Drug use: No  . Sexual activity: Yes    Birth control/ protection: None   Other Topics Concern  . Not on file   Social History Narrative  . No narrative on file    Family History: No family history on file.  Allergies: No Known Allergies  Prescriptions Prior to Admission  Medication Sig Dispense Refill Last Dose  . ibuprofen (ADVIL,MOTRIN) 600 MG tablet Take 1 tablet (600 mg total) by mouth once. 30 tablet 0 Not Taking at Unknown time  . oxyCODONE-acetaminophen (PERCOCET/ROXICET) 5-325 MG tablet Take 1 tablet by mouth every 6 (six) hours as needed for severe pain. 12 tablet 0 Not Taking at Unknown time  . promethazine (PHENERGAN) 12.5 MG tablet Take 1 tablet (12.5 mg total) by mouth every 4 (four) hours as needed for nausea or vomiting. 10 tablet 0      Review of Systems   All systems reviewed and negative except as stated in  HPI  unknown if currently breastfeeding. General appearance: alert and cooperative Extremities: Homans sign is negative, no sign of DVT Presentation: cephalic Fetal monitoringBaseline: 135 bpm, Variability: Good {> 6 bpm), Accelerations: Reactive and Decelerations: Absent Uterine activityFrequency: Every 1-3 minutes Dilation: 4 Effacement (%): 90 Station: 0 Exam by:: HEATHER, CNM   Prenatal labs: ABO, Rh: O/Positive/-- (06/11 0000) Antibody: Negative (06/11 0000) Rubella: Immune RPR: Nonreactive (06/11 0000)  HBsAg: Negative (06/11 0000)  HIV: Non-reactive (06/11 0000)  GBS: Negative (06/25 0000)  Varicella: non-immune 1 hr Glucola 99  Prenatal Transfer Tool  Maternal Diabetes: No Genetic Screening: Normal Maternal Ultrasounds/Referrals: Normal Fetal Ultrasounds or other Referrals:  None Maternal Substance Abuse:  No Significant Maternal Medications:  None Significant Maternal Lab Results: Lab values include: Group B Strep negative  No results found for this or any previous visit (from the past 24 hour(s)).  Patient Active Problem List   Diagnosis Date Noted  . Post term pregnancy at [redacted] weeks gestation 12/12/2016    Assessment: Terri Miller is a 26 y.o. G3P1011 at [redacted]w[redacted]d here for SOL  #Labor: Expectant management #Pain: Epidural upon request  #FWB: Cat 1 #ID: GBS negative #MOF: breast #MOC: undecided #Circ: n/a  [place] Shirley, DO Family Medicine Resident PGY-1  12/12/2016, 4:02 AM  I spoke with and  examined patient and agree with resident/PA/SNM's note and plan of care.  Cheral MarkerKimberly R. Booker, CNM, [facility]NP-BC 12/12/2016 5:39 AM

## 2016-12-12 NOTE — MAU Note (Signed)
PT  SAYS SHE  IS HAVING UC   AND IS AN  INDUCTION  AT 0700.

## 2016-12-12 NOTE — Plan of Care (Signed)
Problem: Education: Goal: Knowledge of condition will improve Admission education, safety, unit protocols reviewed with patient using stratus interpreter "Inez Catalina [phone]." Encouraged patient to call for assistance for first time ambulating. Patient and significant other verbalized understanding of information.   Problem: Nutritional: Goal: Dietary intake will improve Outcome: Completed/Met Date Met: 12/12/16 Ordered patient lunch with assistance of significant other.

## 2016-12-13 LAB — CBC
HCT: 27.1 % — ABNORMAL LOW (ref 36.0–46.0)
HEMOGLOBIN: 9.6 g/dL — AB (ref 12.0–15.0)
MCH: 32.2 pg (ref 26.0–34.0)
MCHC: 35.4 g/dL (ref 30.0–36.0)
MCV: 90.9 fL (ref 78.0–100.0)
Platelets: 220 10*3/uL (ref 150–400)
RBC: 2.98 MIL/uL — AB (ref 3.87–5.11)
RDW: 22.1 % — ABNORMAL HIGH (ref 11.5–15.5)
WBC: 12.5 10*3/uL — ABNORMAL HIGH (ref 4.0–10.5)

## 2016-12-13 LAB — RPR: RPR Ser Ql: NONREACTIVE

## 2016-12-13 NOTE — Clinical Social Work Maternal (Signed)
   CLINICAL SOCIAL WORK MATERNAL/CHILD NOTE  Patient Details  Name: Terri Miller MRN: [id number] Date of Birth: 12/12/2014  Date:  12/13/2016  Clinical Social Worker Initiating Note:  Laurey Arrow Date/ Time Initiated:  12/13/16/1510     Child's Name:  Terri Miller   Legal Guardian:  Mother   Need for Interpreter:  [place]   Date of Referral:  12/12/16     Reason for Referral:  Late or No Prenatal Care    Referral Source:  [facility]   Address:  [address] [address]  Phone number:  [phone]   Household Members:  Self, Minor Children, Spouse   Natural Supports (not living in the home):  Immediate Family, Friends, Extended Family   Professional Supports: None   Employment:     Type of Work:     Education:  Investment banker, operational Resources:  [employer]   Other Resources:  [employer], Physicist, medical    Cultural/Religious Considerations Which May Impact Care:  None Reported  Strengths:  Ability to meet basic needs , Engineer, materials , Home prepared for child    Risk Factors/Current Problems:  None   Cognitive State:  Alert , Able to Concentrate , Linear Thinking    Mood/Affect:  Happy , Comfortable , Interested , Relaxed    CSW Assessment:  CSW met with MOB to complete an assessment for late [facility].  When CSW arrived, MOB was attaching and bonding with infant as evident by engaging in breastfeeding.  CSW utilize [employer] to assist with language barrier. MOB was polite, easy to engage, and receptive to meeting with CSW.  MOB was appropriate with infant during assessment and responded appropriately to infant's cues. CSW explained CSW's role an encouraged MOB to ask questions. CSW inquired about MOB's PNC prior to MOB returning to [place] from [place].  MOB reported MOB initiated [facility] around 12 weeks and was consistent with visits while in [place]. CSW explained the hospital's policy regarding not having documentation regarding MOB's PNC;  MOB was understanding. MOB denied the use of illicit substance and expressed the MOB was not concerned about infants screens. CSW made MOB aware that infant's UDS was negative and CSW will continue to monitor infant's CDS.  CSW informed MOB that if infant's CDS is positive without an explanation, CSW will make a report to [facility] CPS. MOB communicated MOB is prepared for the infant and has all needed items. CSW educated MOB about PPD and informed MOB of possible supports and interventions to decrease PPD.  CSW also encouraged MOB to seek medical attention if needed for increased signs and symptoms for PPD. MOB denied PPD with MOB's oldest child. CSW reviewed safe sleep and SIDS. MOB was knowledgeable.  MOB did not have any questions or concerns at this time, and CSW thanked MOB for allowing CSW to meet with her. CSW Plan/Description:  Information/Referral to [employer] , [employer] , No Further Intervention Required/No Barriers to Discharge (CSW will monitor infant's CDS and will make a report if warranted. )   Laurey Arrow, MSW, LCSW Clinical Social Work [phone]  Dimple Nanas, LCSW 12/13/2016, 4:12 PM

## 2016-12-13 NOTE — Progress Notes (Signed)
POSTPARTUM PROGRESS NOTE  Post Partum Day 1 Subjective:  Terri Miller is a 26 y.o. [id number] 2973w0d s/p SVD.  No acute events overnight.  Pt denies problems with ambulating, voiding or po intake.  She denies nausea or vomiting.  Pain is well controlled.  She has had flatus. She has not had bowel movement.  Lochia Small.   Objective: Blood pressure (!) 111/50, pulse 64, temperature 98.3 F (36.8 C), temperature source Oral, resp. rate 18, height 5\' 5"  (1.651 m), weight 53.5 kg (118 lb), SpO2 100 %, unknown if currently breastfeeding.  Physical Exam:  General: alert, cooperative and no distress Lochia: Normal flow Uterine Fundus: Firm DVT Evaluation: No calf swelling or tenderness Extremities: No edema   Recent Labs  12/12/16 0358 12/13/16 0639  HGB 11.4* 9.6*  HCT 34.4* 27.1*    Assessment/Plan:  ASSESSMENT: Terri Miller is a 26 y.o. [id number] 4873w0d s/p SVD  Plan for discharge tomorrow, Breastfeeding and Contraception undecided   LOS: 1 day   Jeanie CooksSarah Chen, Medical Student   OB FELLOW MEDICAL STUDENT NOTE ATTESTATION  I confirm that I have verified the information documented in the medical student's note and that I have also personally performed the physical exam and all medical decision making activities.   Frederik PearJulie P Degele, MD OB Fellow 12/13/2016, 11:36 AM

## 2016-12-14 MED ORDER — IBUPROFEN 600 MG PO TABS: 600.0000 mg | ORAL_TABLET | Freq: Four times a day (QID) | ORAL | 0 refills | Status: DC | PRN

## 2016-12-14 NOTE — Discharge Instructions (Signed)

## 2016-12-14 NOTE — Discharge Summary (Signed)
OB Discharge Summary Visit conducted with French video interpreter    Patient Name: Terri Miller DOB: 09/27/1990 MRN: [id number]  Date of admission: 12/12/2016 Delivering MD: Pincus LargePHELPS, JAZMA Y   Date of discharge: 12/14/2016  Admitting diagnosis: 41 WEEKS CTX Intrauterine pregnancy: 7558w0d     Secondary diagnosis:  Active Problems:   Post term pregnancy at [redacted] weeks gestation  Additional problems: none     Discharge diagnosis: Term Pregnancy Delivered                                                                                                Post partum procedures:none  Augmentation: AROM  Complications: None  Hospital course:  Onset of Labor With Vaginal Delivery     26 y.o. yo [id number] at 7858w0d was admitted in Latent Labor on 12/12/2016. Patient had an uncomplicated labor course as follows:  Membrane Rupture Time/Date: 9:14 AM ,12/12/2016   Intrapartum Procedures: Episiotomy: None [1]                                         Lacerations:  None [1]  Patient had a delivery of a Viable infant. 12/12/2016  Information for the patient's newborn:  Terri Miller, Girl Dudley MajorBalkissa [[id number]][[id number]]  Delivery Method: Vaginal, Spontaneous Delivery (Filed from Delivery Summary)    Pateint had an uncomplicated postpartum course.  She is ambulating, tolerating a regular diet, passing flatus, and urinating well. Patient is discharged home in stable condition on 12/14/16.   Physical exam  Vitals:   12/13/16 0115 12/13/16 0629 12/13/16 1800 12/14/16 0529  BP: 125/77 (!) 111/50 (!) 101/58 104/74  Pulse: 84 64 68 72  Resp: 18 18 16 18   Temp: 99.5 F (37.5 C) 98.3 F (36.8 C) 98.5 F (36.9 C) 98.5 F (36.9 C)  TempSrc: Oral Oral Oral Axillary  SpO2:      Weight:      Height:       General: alert and cooperative Lochia: appropriate Uterine Fundus: firm Incision: N/A DVT Evaluation: No evidence of DVT seen on physical exam. Labs: Lab Results  Component Value Date   WBC 12.5 (H)  12/13/2016   HGB 9.6 (L) 12/13/2016   HCT 27.1 (L) 12/13/2016   MCV 90.9 12/13/2016   PLT 220 12/13/2016   No flowsheet data found.  Discharge instruction: per After Visit Summary and "Baby and Me Booklet".  After visit meds:  Allergies as of 12/14/2016   No Known Allergies     Medication List    TAKE these medications   ibuprofen 600 MG tablet Commonly known as:  ADVIL,MOTRIN Take 1 tablet (600 mg total) by mouth every 6 (six) hours as needed.   IRON PO Take 1 tablet by mouth daily. otc medication, pt not sure of dosage       Diet: routine diet  Activity: Advance as tolerated. Pelvic rest for 6 weeks.   Outpatient follow up:4 weeks Follow up Appt:No future appointments. Follow up Visit:No Follow-up on file.  Postpartum  contraception: Undecided  Newborn Data: Live born female  Birth Weight: 7 lb 10.6 oz (3475 g) APGAR: 9, 9  Baby Feeding: Breast Disposition:home with mother   12/14/2016 Cam Hai, CNM  9:02 AM

## 2016-12-14 NOTE — Lactation Note (Signed)
This note was copied from a baby's chart. Lactation Consultation Note  Patient Name: Girl Denton BrickBalkissa Ousseini Bizo AVWUJ'WToday's Date: 12/14/2016 Reason for consult: Follow-up assessment  Baby 46 hours old. Assisted by [place] interpreter via [employer], Aline [phone]. Mom reports that baby nursing well, her milk is increasing and baby is satisfied and sleeps well after nursing. Mom states that she nursed her first child over 2 years. Mom aware of OP/BFSG and LC phone line assistance after D/C.   Maternal Data    Feeding Feeding Type: Breast Fed Length of feed: 5 min  LATCH Score/Interventions                      Lactation Tools Discussed/Used     Consult Status Consult Status: PRN    Sherlyn HayJennifer D Gurvir Schrom 12/14/2016, 9:06 AM

## 2017-05-21 IMAGING — US US OB COMP LESS 14 WK
1 series · 15 of 28 positions shown · non-contrast
Comparison: None.

CLINICAL DATA: Pain and bleeding

EXAM:
OBSTETRIC <14 WK US AND TRANSVAGINAL OB US
TECHNIQUE: Both transabdominal and transvaginal ultrasound examinations were
performed for complete evaluation of the gestation as well as the
maternal uterus, adnexal regions, and pelvic cul-de-sac.
Transvaginal technique was performed to assess early pregnancy.

[Series 1: us ob comp less 14 wk · 15 of 47 slices shown]
[im 1/47]
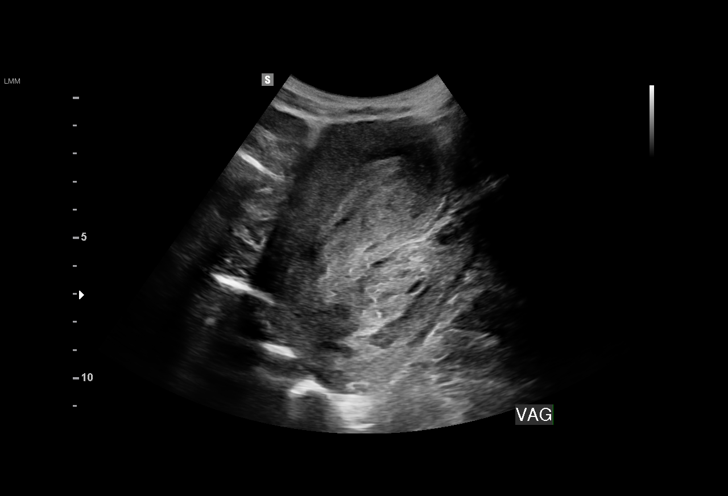
[im 4/47]
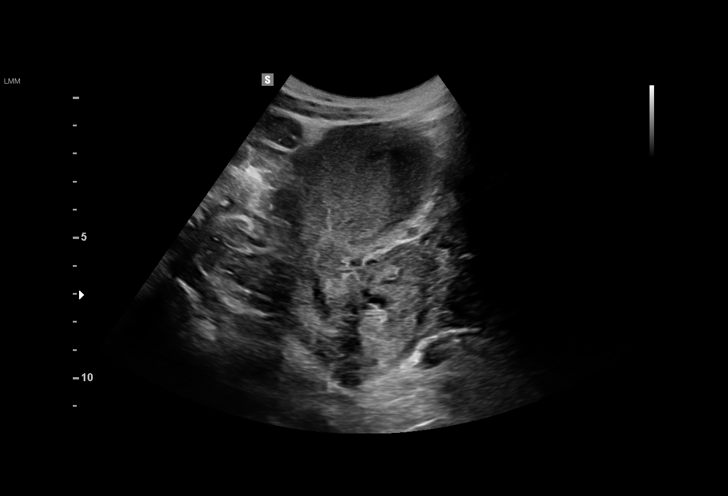
[im 7/47]
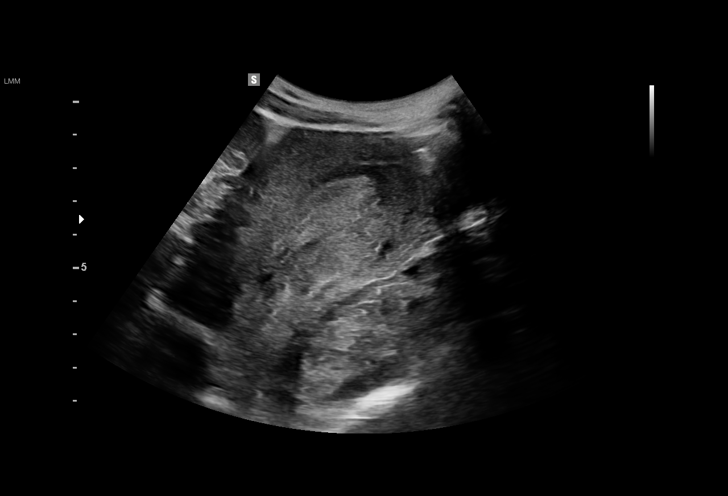
[im 11/47]
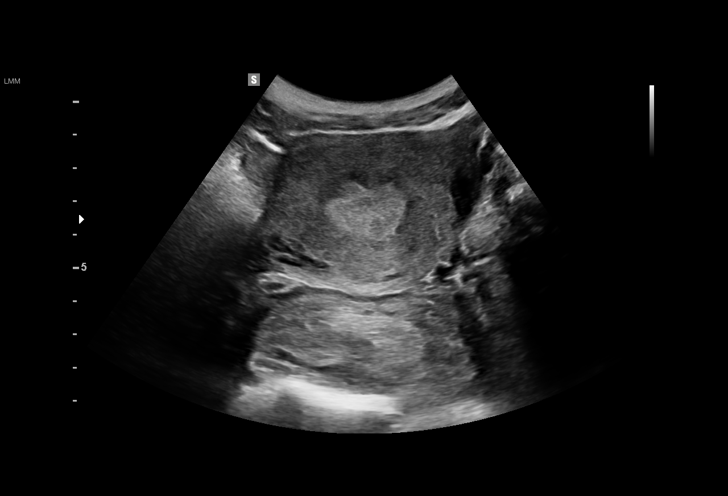
[im 14/47]
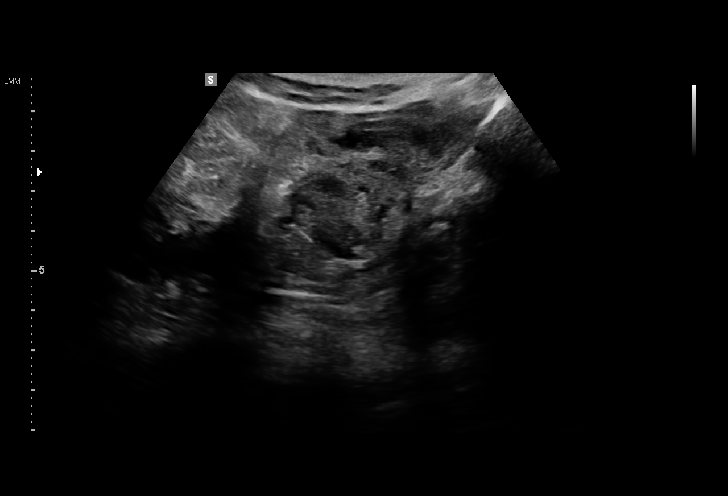
[im 18/47]
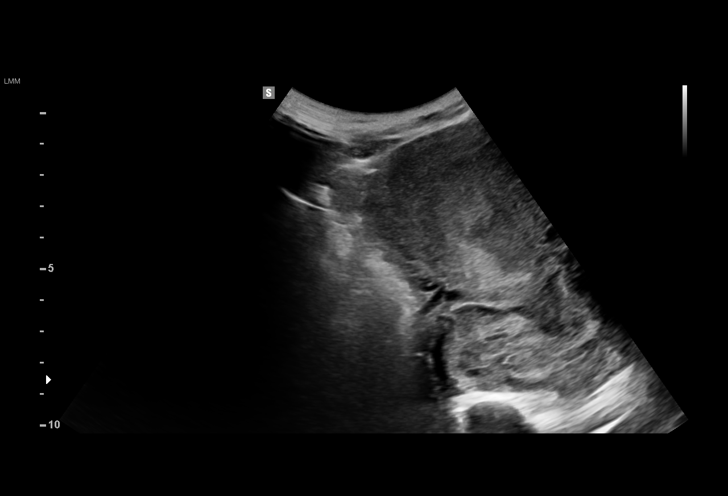
[im 21/47]
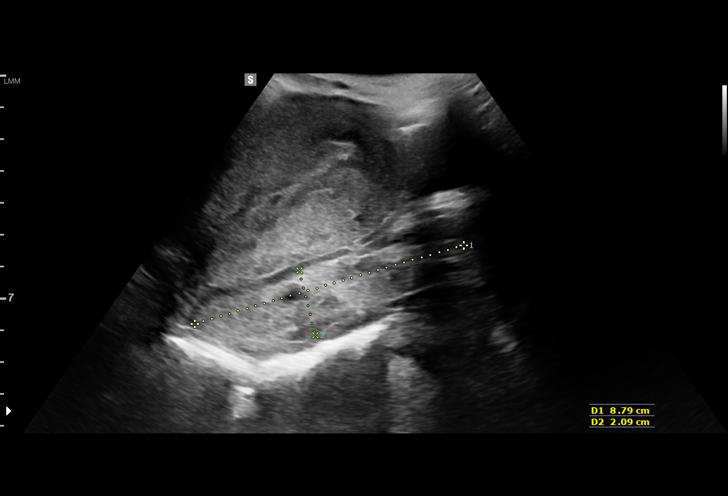
[im 24/47]
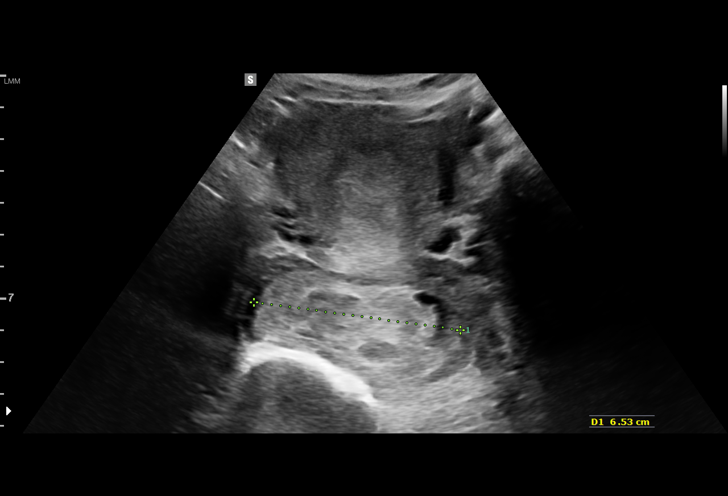
[im 26/47]
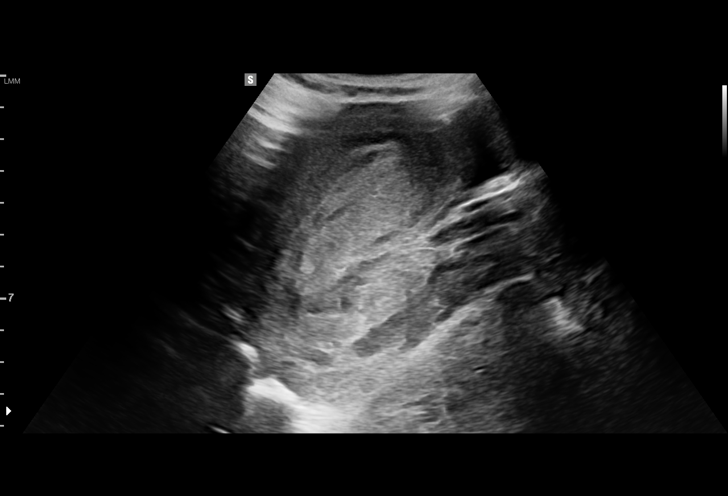
[im 29/47]
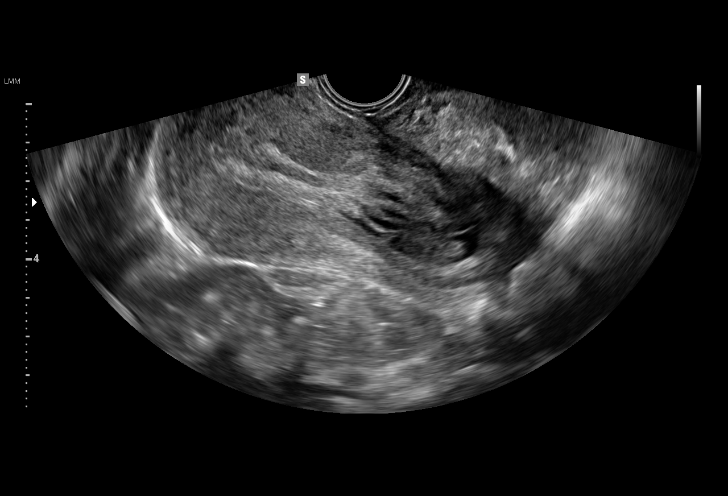
[im 33/47]
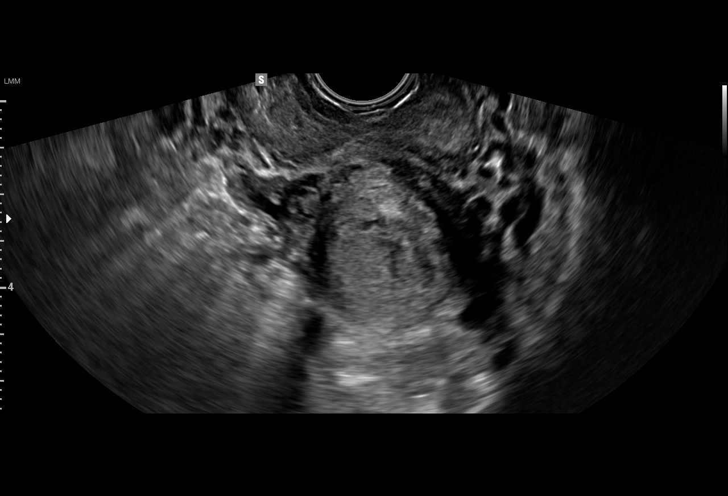
[im 36/47]
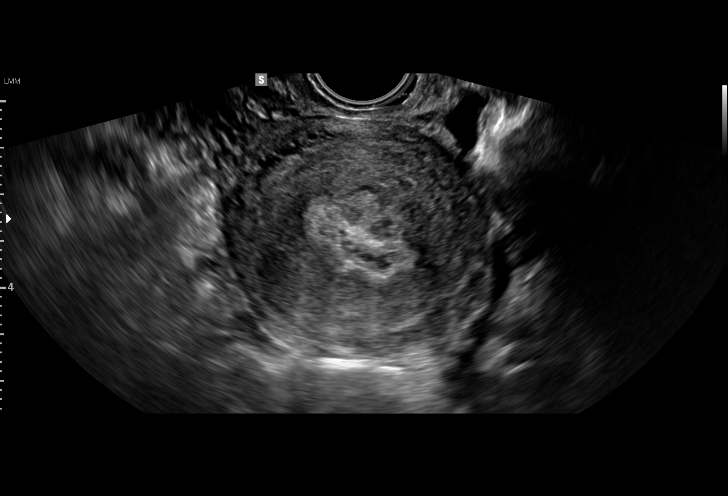
[im 40/47]
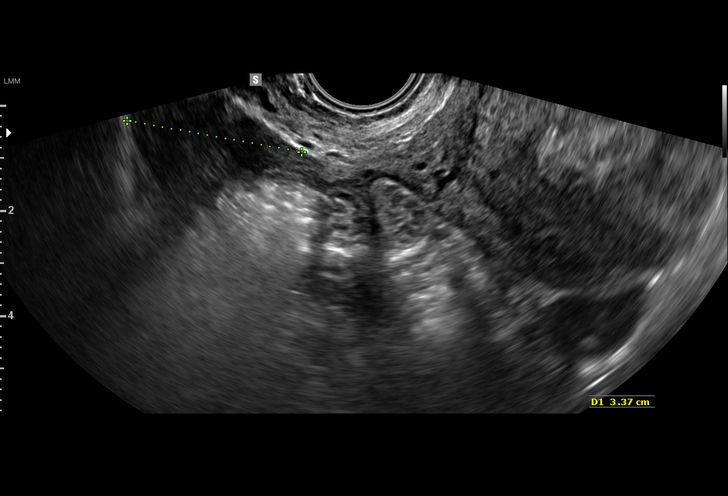
[im 43/47]
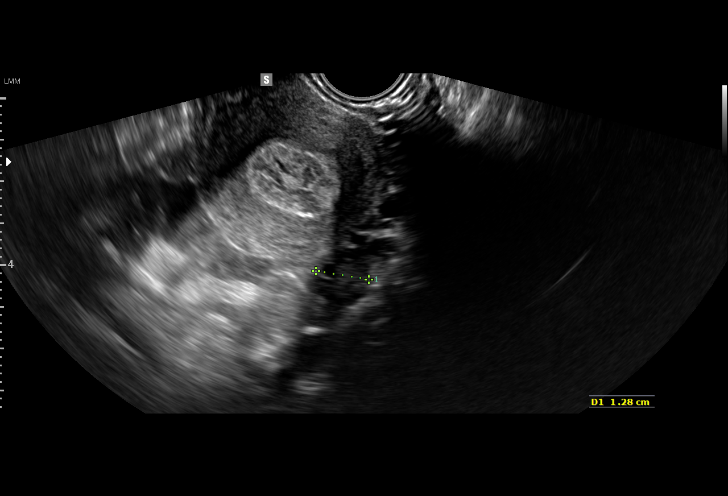
[im 47/47]
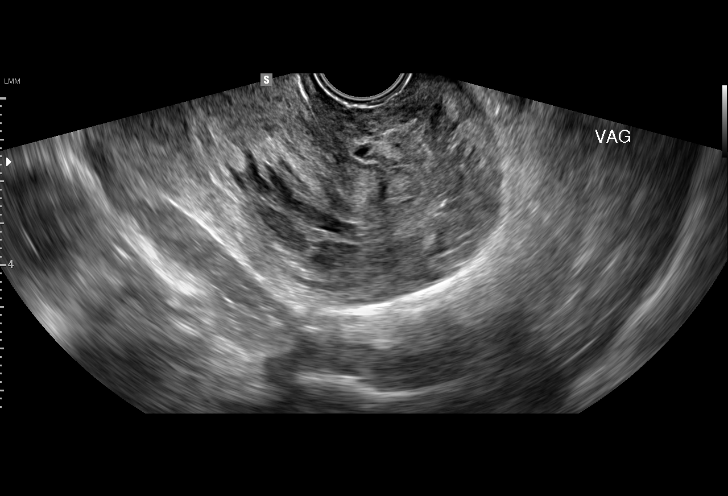

[15 of 28 positions shown; findings below may reference images not displayed]

FINDINGS: Intrauterine gestational sac: None

Yolk sac:  No

Embryo:  No

Cardiac Activity: No

Heart Rate:   bpm

MSD:   mm    w     d

CRL:    mm    w    d                  US EDC:

Subchorionic hemorrhage: Large volume of complex echogenic material
in the vagina, probably blood. Similar material is present in the
lower uterine segment endometrial canal.

Maternal uterus/adnexae: Both ovaries are normal. No abnormal pelvic
fluid collections.
IMPRESSION: No gestation sac is visible. Large volume of blood in the lower
uterine segment and vagina. Serial HCG and follow-up ultrasound
recommended.

## 2017-05-21 IMAGING — US US OB TRANSVAGINAL
1 series · 15 of 26 positions shown · non-contrast
Comparison: Study obtained earlier in the day

CLINICAL DATA: Persistent vaginal bleeding

EXAM:
TRANSVAGINAL OB ULTRASOUND
TECHNIQUE: Transvaginal ultrasound was performed for complete evaluation of the
gestation as well as the maternal uterus, adnexal regions, and
pelvic cul-de-sac.

[Series 1: us ob transvaginal · 15 of 26 slices shown]
[im 1/26]
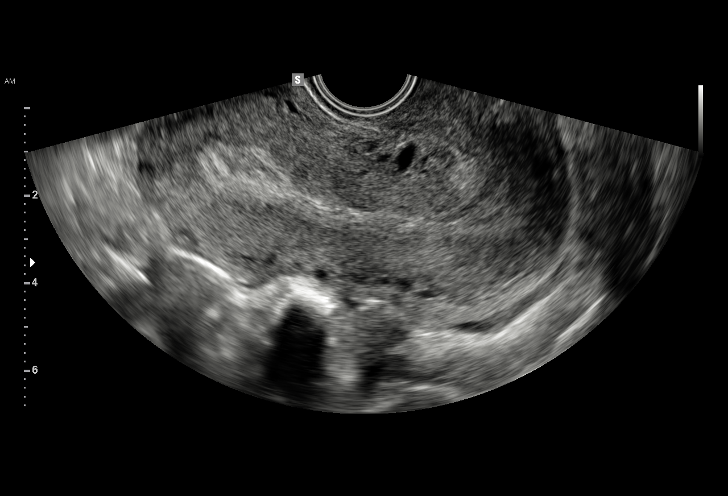
[im 3/26]
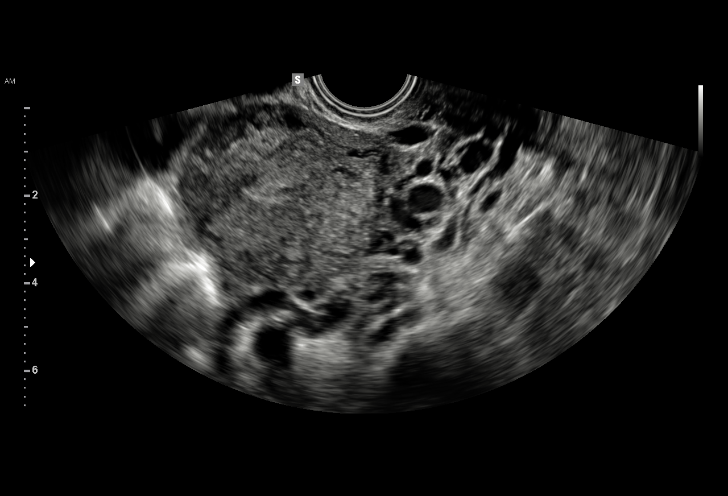
[im 5/26]
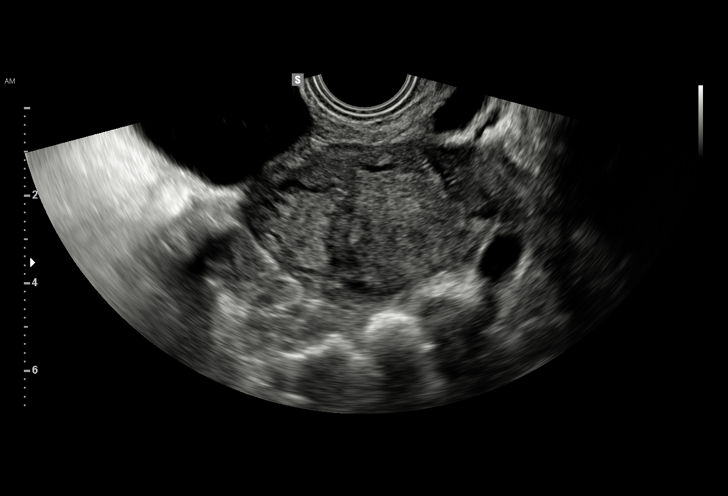
[im 7/26]
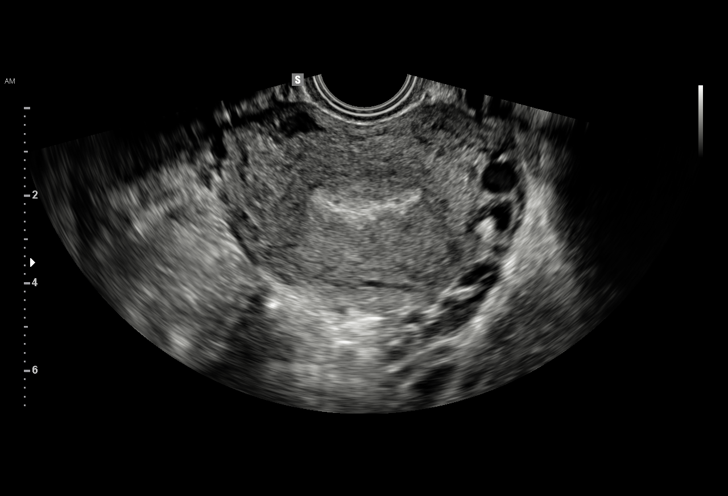
[im 8/26]
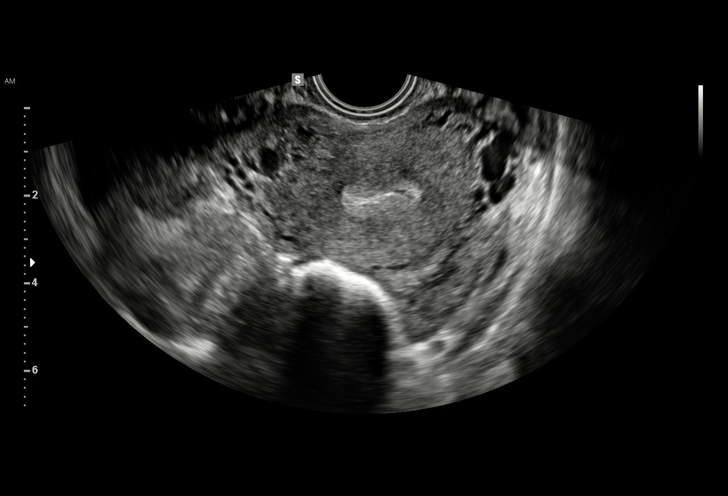
[im 10/26]
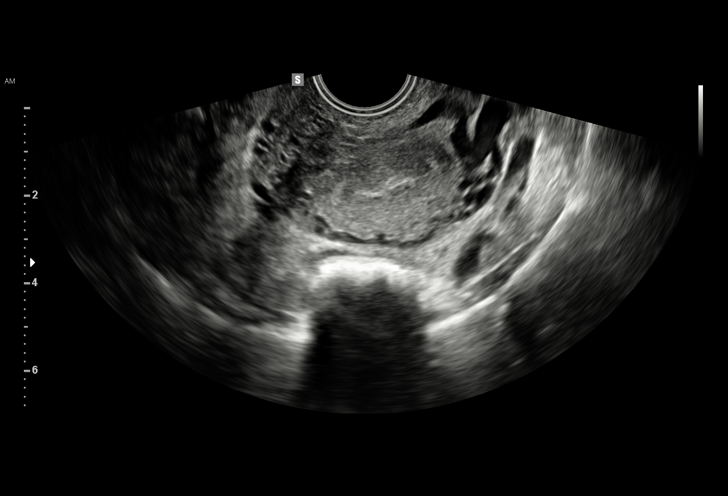
[im 12/26]
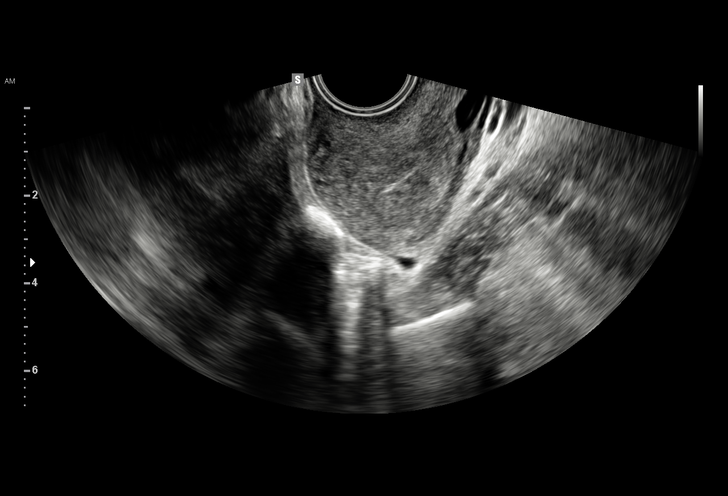
[im 14/26]
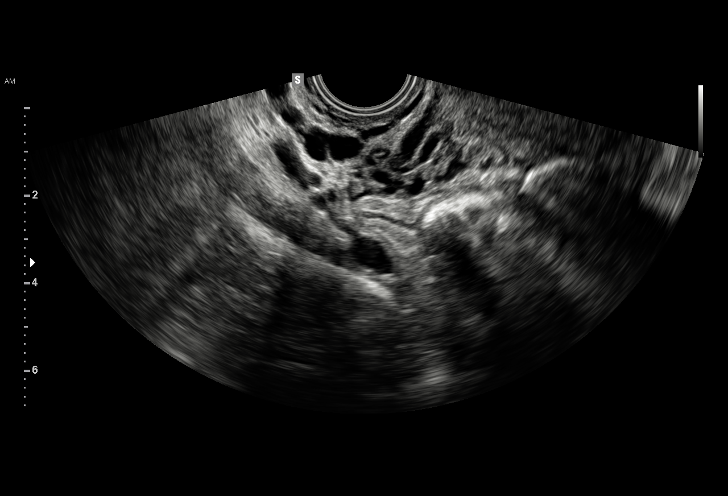
[im 15/26]
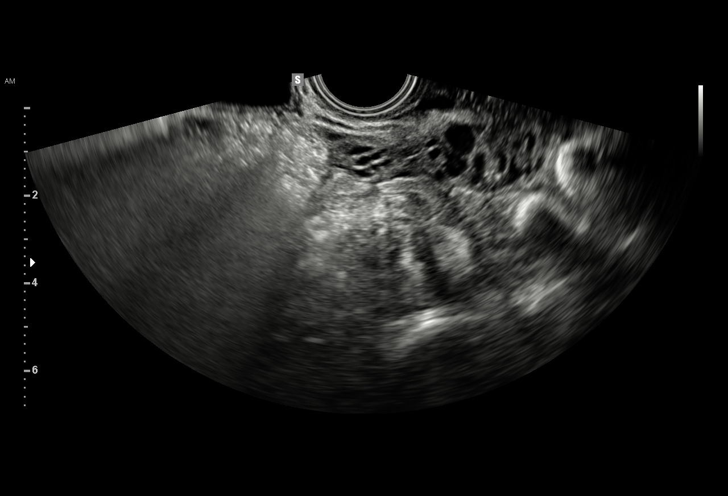
[im 17/26]
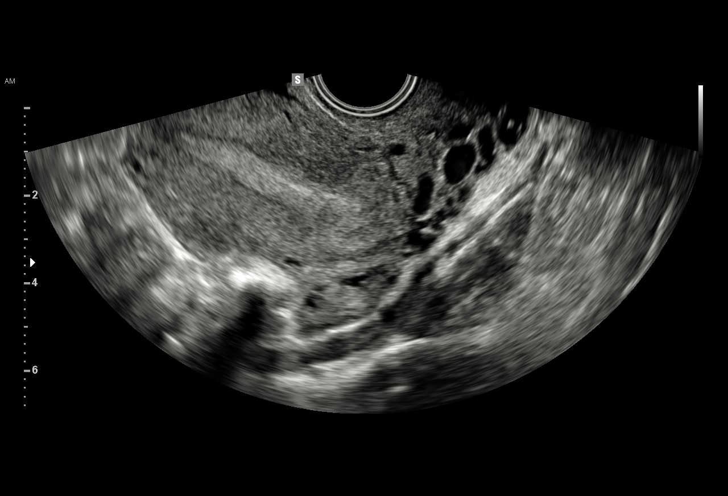
[im 19/26]
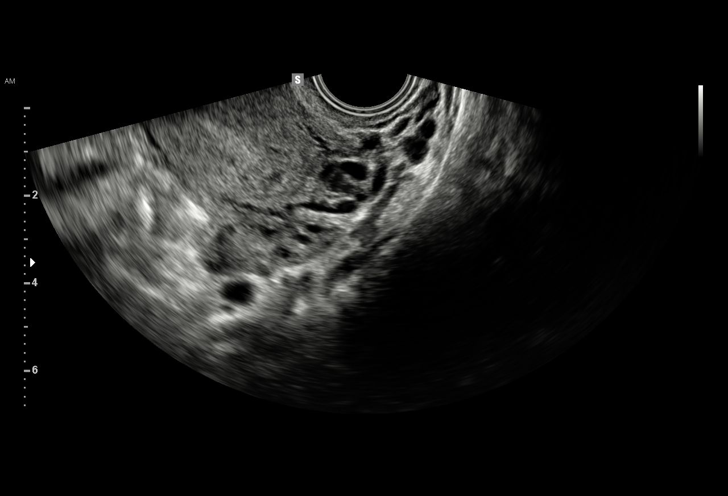
[im 20/26]
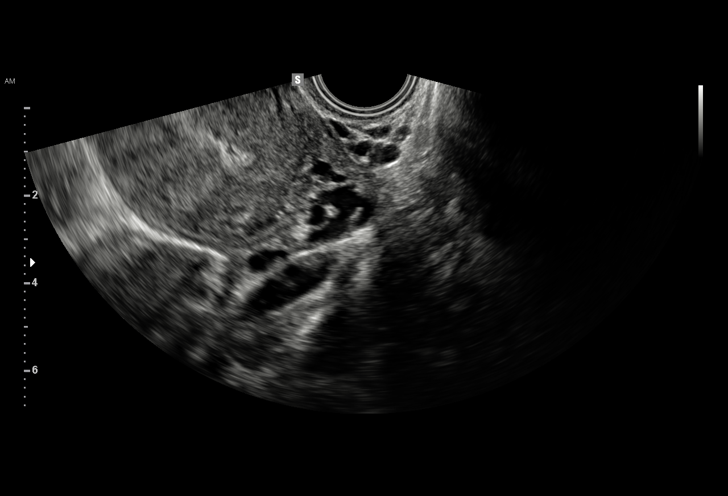
[im 22/26]
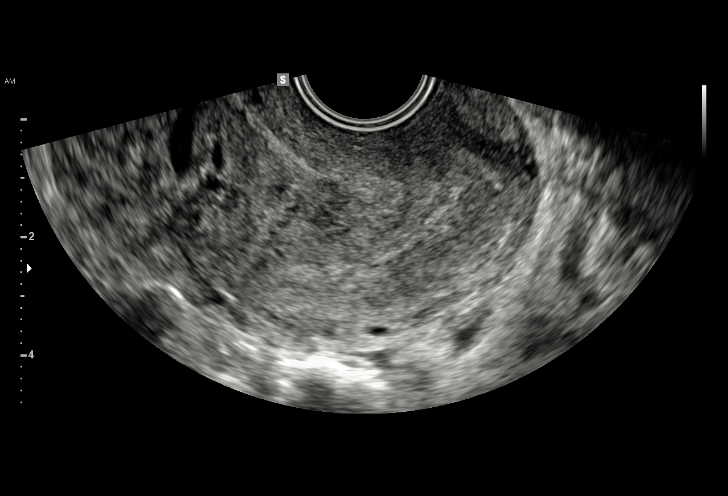
[im 24/26]
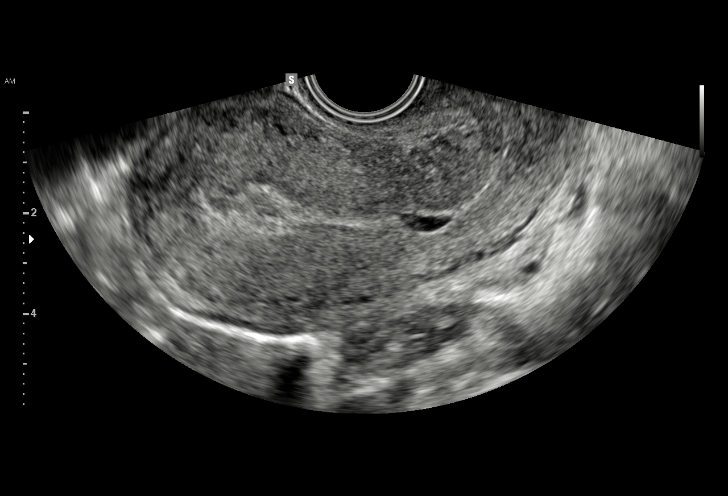
[im 26/26]
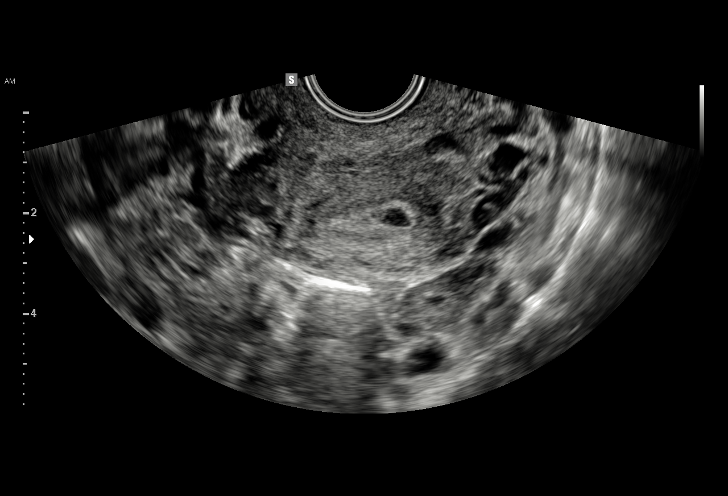

[15 of 26 positions shown; findings below may reference images not displayed]

FINDINGS: Intrauterine gestational sac: Not visualized

Yolk sac:  Not visualized

Embryo:  Not visualized

Cardiac Activity: Not visualized

Subchorionic hemorrhage:  None visualized.

Maternal uterus/adnexae: There is complex material in the
endometrium. In the lower uterine segment, there is focal fluid,
similar to earlier in the day.

No extrauterine pelvic or adnexal masses noted. There is no free
pelvic fluid.
IMPRESSION: Findings consistent with spontaneous abortion. There is apparent
hemorrhage within the endometrium. Retained products of conception
cannot be excluded sonographically given the degree of irregularity
and complexity of the apparent fluid within the endometrium. There
does appear to be less abnormal echogenic material in the
endometrium compared to earlier in the day, consistent with the
progressive bleeding since the study obtained several hours earlier.

## 2017-10-23 ENCOUNTER — Emergency Department (HOSPITAL_COMMUNITY): Payer: Self-pay

## 2017-10-23 ENCOUNTER — Other Ambulatory Visit: Payer: Self-pay

## 2017-10-23 ENCOUNTER — Encounter (HOSPITAL_COMMUNITY): Payer: Self-pay | Admitting: *Deleted

## 2017-10-23 ENCOUNTER — Emergency Department (HOSPITAL_COMMUNITY)
Admission: EM | Admit: 2017-10-23 | Discharge: 2017-10-23 | Disposition: A | Discharge status: Home / Self Care | Payer: Self-pay | Attending: Emergency Medicine | Admitting: Emergency Medicine

## 2017-10-23 DIAGNOSIS — Z79899 Other long term (current) drug therapy: Secondary | ICD-10-CM | POA: Insufficient documentation

## 2017-10-23 DIAGNOSIS — Z3490 Encounter for supervision of normal pregnancy, unspecified, unspecified trimester: Secondary | ICD-10-CM

## 2017-10-23 DIAGNOSIS — R55 Syncope and collapse: Secondary | ICD-10-CM | POA: Insufficient documentation

## 2017-10-23 DIAGNOSIS — Z3A00 Weeks of gestation of pregnancy not specified: Secondary | ICD-10-CM | POA: Insufficient documentation

## 2017-10-23 DIAGNOSIS — I951 Orthostatic hypotension: Secondary | ICD-10-CM

## 2017-10-23 DIAGNOSIS — O26819 Pregnancy related exhaustion and fatigue, unspecified trimester: Secondary | ICD-10-CM | POA: Insufficient documentation

## 2017-10-23 LAB — URINALYSIS, ROUTINE W REFLEX MICROSCOPIC
BILIRUBIN URINE: NEGATIVE
Glucose, UA: NEGATIVE mg/dL
Hgb urine dipstick: NEGATIVE
Ketones, ur: NEGATIVE mg/dL
LEUKOCYTES UA: NEGATIVE
NITRITE: NEGATIVE
Protein, ur: NEGATIVE mg/dL
SPECIFIC GRAVITY, URINE: 1.005 (ref 1.005–1.030)
pH: 6 (ref 5.0–8.0)

## 2017-10-23 LAB — HCG, QUANTITATIVE, PREGNANCY: HCG, BETA CHAIN, QUANT, S: 24 m[IU]/mL — AB (ref <5)

## 2017-10-23 LAB — I-STAT CHEM 8, ED
BUN: 19 mg/dL (ref 6–20)
CHLORIDE: 106 mmol/L (ref 101–111)
CREATININE: 0.7 mg/dL (ref 0.44–1.00)
Calcium, Ion: 1.21 mmol/L (ref 1.15–1.40)
Glucose, Bld: 85 mg/dL (ref 65–99)
HEMATOCRIT: 40 % (ref 36.0–46.0)
HEMOGLOBIN: 13.6 g/dL (ref 12.0–15.0)
POTASSIUM: 4.1 mmol/L (ref 3.5–5.1)
Sodium: 139 mmol/L (ref 135–145)
TCO2: 23 mmol/L (ref 22–32)

## 2017-10-23 LAB — CBC WITH DIFFERENTIAL/PLATELET
ABS IMMATURE GRANULOCYTES: 0 10*3/uL (ref 0.0–0.1)
Basophils Absolute: 0 10*3/uL (ref 0.0–0.1)
Basophils Relative: 0 %
EOS ABS: 0.3 10*3/uL (ref 0.0–0.7)
Eosinophils Relative: 4 %
HCT: 40.5 % (ref 36.0–46.0)
Hemoglobin: 13.4 g/dL (ref 12.0–15.0)
IMMATURE GRANULOCYTES: 0 %
LYMPHS PCT: 35 %
Lymphs Abs: 2.5 10*3/uL (ref 0.7–4.0)
MCH: 28.8 pg (ref 26.0–34.0)
MCHC: 33.1 g/dL (ref 30.0–36.0)
MCV: 86.9 fL (ref 78.0–100.0)
Monocytes Absolute: 0.8 10*3/uL (ref 0.1–1.0)
Monocytes Relative: 11 %
NEUTROS ABS: 3.6 10*3/uL (ref 1.7–7.7)
NEUTROS PCT: 50 %
Platelets: 307 10*3/uL (ref 150–400)
RBC: 4.66 MIL/uL (ref 3.87–5.11)
RDW: 12.3 % (ref 11.5–15.5)
WBC: 7.2 10*3/uL (ref 4.0–10.5)

## 2017-10-23 LAB — CBG MONITORING, ED: Glucose-Capillary: 79 mg/dL (ref 65–99)

## 2017-10-23 LAB — I-STAT BETA HCG BLOOD, ED (MC, WL, AP ONLY): HCG, QUANTITATIVE: 21.6 m[IU]/mL — AB (ref <5)

## 2017-10-23 MED ORDER — SODIUM CHLORIDE 0.9 % IV BOLUS
1000.0000 mL | Freq: Once | INTRAVENOUS | Status: AC
  Administered 2017-10-23: 1000 mL via INTRAVENOUS

## 2017-10-23 NOTE — ED Triage Notes (Signed)
Pt arrived by EMS for near syncopal episode at home. Pt's husband reports hearing a thud in the bedroom, found pt lying in the middle of bedroom. EMS reports pt was nonverbal with them at the scene and "acted like pt was going to pass out again but she was ambulatory to the truck." Pt speaks some [place], prefers Nurse, learning disability for [name]

## 2017-10-23 NOTE — ED Notes (Signed)
Pt reports having a cough that has since resolved and she feels better. Unable to recall why she is here

## 2017-10-23 NOTE — ED Notes (Signed)
Patient to US

## 2017-10-23 NOTE — ED Provider Notes (Signed)
[facility] EMERGENCY DEPARTMENT Provider Note   CSN: [id number] Arrival date & time: 10/23/17  0128     History   Chief Complaint Chief Complaint  Patient presents with  . Near Syncope    HPI Terri Miller is a 27 y.o. female.  The history is provided by the patient.  Near Syncope  This is a new problem. The current episode started less than 1 hour ago. The problem occurs constantly. The problem has been resolved. Pertinent negatives include no chest pain, no abdominal pain, no headaches and no shortness of breath. Nothing aggravates the symptoms. Nothing relieves the symptoms. She has tried nothing for the symptoms. The treatment provided no relief.  Fall  This is a new problem. The current episode started less than 1 hour ago. The problem occurs constantly. The problem has been resolved. Pertinent negatives include no chest pain, no abdominal pain, no headaches and no shortness of breath. Nothing aggravates the symptoms. She has tried nothing for the symptoms. The treatment provided no relief.  No travel no OCP.    Past Medical History:  Diagnosis Date  . Medical history non-contributory     Patient Active Problem List   Diagnosis Date Noted  . Post term pregnancy at [redacted] weeks gestation 12/12/2016    Past Surgical History:  Procedure Laterality Date  . NO PAST SURGERIES       OB History    Gravida  3   Para  2   Term  2   Preterm  0   AB  1   Living  2     SAB  1   TAB  0   Ectopic  0   Multiple  0   Live Births  1            Home Medications    Prior to Admission medications   Medication Sig Start Date End Date Taking? Authorizing Provider  ibuprofen (ADVIL,MOTRIN) 600 MG tablet Take 1 tablet (600 mg total) by mouth every 6 (six) hours as needed. 12/14/16   Arabella Merles, CNM  IRON PO Take 1 tablet by mouth daily. otc medication, pt not sure of dosage    [provider]    Family History No family  history on file.  Social History Social History   Tobacco Use  . Smoking status: Never Smoker  . Smokeless tobacco: Never Used  Substance Use Topics  . Alcohol use: No  . Drug use: No     Allergies   Patient has no known allergies.   Review of Systems Review of Systems  Constitutional: Negative for diaphoresis and fever.  Respiratory: Negative for shortness of breath.   Cardiovascular: Positive for near-syncope. Negative for chest pain, palpitations and leg swelling.  Gastrointestinal: Negative for abdominal pain and nausea.  Neurological: Negative for weakness and headaches.  All other systems reviewed and are negative.    Physical Exam Updated Vital Signs BP 118/75 (BP Location: Right Arm)   Pulse 77   Temp (!) 97.3 F (36.3 C) (Temporal)   Resp 17   LMP 09/28/2017   SpO2 100%   Physical Exam  Constitutional: She is oriented to person, place, and time. She appears well-developed and well-nourished. No distress.  HENT:  Head: Normocephalic and atraumatic.  Mouth/Throat: No oropharyngeal exudate.  Eyes: Pupils are equal, round, and reactive to light. Conjunctivae and EOM are normal.  Neck: Normal range of motion. Neck supple.  Cardiovascular: Normal rate, regular  rhythm, normal heart sounds and intact distal pulses.  Pulmonary/Chest: Effort normal and breath sounds normal. No stridor. She has no wheezes. She has no rales.  Abdominal: Soft. She exhibits no mass. There is no tenderness. There is no rebound and no guarding.  Musculoskeletal: Normal range of motion. She exhibits no edema or tenderness.  Neurological: She is alert and oriented to person, place, and time. She displays normal reflexes.  Skin: Skin is warm and dry. Capillary refill takes less than 2 seconds.  Psychiatric: She has a normal mood and affect.     ED Treatments / Results  Labs (all labs ordered are listed, but only abnormal results are displayed) Labs Reviewed  URINALYSIS, ROUTINE W  REFLEX MICROSCOPIC - Abnormal; Notable for the following components:      Result Value   Color, Urine COLORLESS (*)    All other components within normal limits  I-STAT BETA HCG BLOOD, ED (MC, WL, AP ONLY) - Abnormal; Notable for the following components:   I-stat hCG, quantitative 21.6 (*)    All other components within normal limits  CBC WITH DIFFERENTIAL/PLATELET  HCG, QUANTITATIVE, PREGNANCY  CBG MONITORING, ED  CBG MONITORING, ED  I-STAT CHEM 8, ED    EKG EKG Interpretation  Date/Time:  Wednesday Oct 23 2017 01:38:17 EDT Ventricular Rate:  82 PR Interval:    QRS Duration: 85 QT Interval:  360 QTC Calculation: 421 R Axis:   71 Text Interpretation:  Sinus rhythm Confirmed by Nicanor Alcon, Shantoria Ellwood ([id number]) on 10/23/2017 2:09:52 AM   Radiology No results found.  Procedures Procedures (including critical care time)  Medications Ordered in ED Medications  sodium chloride 0.9 % bolus 1,000 mL (has no administration in time range)     Case d/w Dr. Adrian Blackwater, too early to be an ectopic.  Stable for discharge with follow up.  If abdominal pain or bleeding follow [facility] with [facility].    Final Clinical Impressions(s) / ED Diagnoses   Return for weakness, numbness, changes in vision or speech, fevers >100.4 unrelieved by medication, shortness of breath, intractable vomiting, or diarrhea, abdominal pain, Inability to tolerate liquids or food, cough, altered mental status or any concerns. No signs of systemic illness or infection. The patient is nontoxic-appearing on exam and vital signs are within normal limits.   I have reviewed the triage vital signs and the nursing notes. Pertinent labs &imaging results that were available during my care of the patient were reviewed by me and considered in my medical decision making (see chart for details).  After history, exam, and medical workup I feel the patient has been appropriately medically screened and is safe for discharge  home. Pertinent diagnoses were discussed with the patient. Patient was given return precautions.     Kevante Lunt, MD 10/23/17 [phone]

## 2017-10-23 NOTE — ED Notes (Signed)
Patient returned from US.

## 2018-01-06 ENCOUNTER — Ambulatory Visit (INDEPENDENT_AMBULATORY_CARE_PROVIDER_SITE_OTHER): Payer: Self-pay | Admitting: General Practice

## 2018-01-06 ENCOUNTER — Encounter: Payer: Self-pay | Admitting: Family Medicine

## 2018-01-06 DIAGNOSIS — Z3201 Encounter for pregnancy test, result positive: Secondary | ICD-10-CM

## 2018-01-06 LAB — POCT PREGNANCY, URINE: PREG TEST UR: POSITIVE — AB

## 2018-01-06 MED ORDER — PRENATAL VITAMINS 0.8 MG PO TABS
1.0000 | ORAL_TABLET | Freq: Every day | ORAL | 12 refills | Status: DC
Start: 2018-01-06 — End: 2020-09-29

## 2018-01-06 NOTE — Progress Notes (Signed)
I have reviewed the chart and agree with nursing staff's documentation of this patient's encounter.  Thressa ShellerHeather Tyreon Frigon, CNM 01/06/2018 9:18 AM

## 2018-01-06 NOTE — Progress Notes (Signed)
Patient presents to office today for UPT. UPT +. Patient reports first positive home test back in May at the ER. LMP 09/28/17 EDD 07/05/18 6218w2d. Patient denies taking any meds or vitamins. Rx sent to pharmacy for PNV & recommended she begin OB care. Patient verbalized understanding & had no questions.

## 2018-01-09 ENCOUNTER — Encounter: Payer: Self-pay | Admitting: Family Medicine

## 2018-01-09 ENCOUNTER — Encounter: Payer: Self-pay | Admitting: Obstetrics and Gynecology

## 2018-01-09 ENCOUNTER — Ambulatory Visit (INDEPENDENT_AMBULATORY_CARE_PROVIDER_SITE_OTHER): Payer: Self-pay | Admitting: Obstetrics and Gynecology

## 2018-01-09 VITALS — BP 93/57 | HR 87 | Wt 98.3 lb

## 2018-01-09 DIAGNOSIS — Z789 Other specified health status: Secondary | ICD-10-CM | POA: Insufficient documentation

## 2018-01-09 DIAGNOSIS — Z3480 Encounter for supervision of other normal pregnancy, unspecified trimester: Secondary | ICD-10-CM

## 2018-01-09 DIAGNOSIS — Z758 Other problems related to medical facilities and other health care: Secondary | ICD-10-CM

## 2018-01-09 NOTE — Progress Notes (Signed)
Terri Miller interpreter present. Patient speaks some English and has been in [place] 3 years/  Subjective:    Terri Miller is being seen today for her first obstetrical visit.  This is not a planned pregnancy. LMP unsure "April or May.". Her obstetrical history is significant for no risk factors. Relationship with FOB: spouse, living together. Patient does intend to breast feed. Pregnancy history fully reviewed.  Patient reports no complaints. [facility].   OB History  Gravida Para Term Preterm AB Living  4 2 2  0 1 2  SAB TAB Ectopic Multiple Live Births  1 0 0 0 1    # Outcome Date GA Lbr Len/2nd Weight Sex Delivery Anes PTL Lv  4 Current           3 Term 12/12/16 3958w0d 05:00 / 00:10 7 lb 10.6 oz (3.475 kg) F Vag-Spont None  LIV  2 SAB 07/06/15 2050w3d         1 Term 10/01/13    F Vag-Spont     P1 in [place], no problems, about 3000 grams  Past Medical History:  Diagnosis Date  . Medical history non-contributory    Past Surgical History:  Procedure Laterality Date  . NO PAST SURGERIES      Review of Systems:   Review of Systems  Constitutional: Negative for appetite change, chills, fatigue and fever.  HENT: Negative for congestion.   Respiratory: Negative for chest tightness.   Gastrointestinal: Negative for abdominal pain, constipation, diarrhea, nausea and vomiting.  Endocrine: Negative for cold intolerance.  Genitourinary: Negative for dysuria, flank pain, hematuria, urgency, vaginal bleeding, vaginal discharge and vaginal pain.  Musculoskeletal: Negative for back pain.  Psychiatric/Behavioral: The patient is not nervous/anxious.     Objective:     BP (!) 93/57   Pulse 87   Wt 98 lb 4.8 oz (44.6 kg)   LMP 09/28/2017 (LMP Unknown)   BMI 16.36 kg/m  Physical Exam  Nursing note and vitals reviewed. Constitutional: She is oriented to person, place, and time. She appears well-developed and well-nourished. No distress.  HENT:  Head: Normocephalic.   Good dentitiion  Eyes: No scleral icterus.  Neck: Neck supple. No thyromegaly present.  Cardiovascular: Normal rate and regular rhythm.  No murmur heard. Respiratory: Effort normal and breath sounds normal.  GI: Soft. There is no tenderness. There is no guarding.  Genitourinary:  Genitourinary Comments: VE: cx long, closed, posterior. Uterus 14-16 weeks size; no adnexal tenderness or masses  Musculoskeletal: Normal range of motion. She exhibits no edema.  Neurological: She is alert and oriented to person, place, and time.  Skin: Skin is warm and dry.  Psychiatric: She has a normal mood and affect. Her behavior is normal.       Assessment:    Pregnancy: [id number] Patient Active Problem List   Diagnosis Date Noted  . Supervision of other normal pregnancy, antepartum 01/09/2018  . Language barrier 01/09/2018        Plan:     Initial labs drawn. Prenatal vitamins.  Problem list reviewed and updated. AFP3 discussed: requested. Role of ultrasound in pregnancy discussed; fetal survey: ordered. Amniocentesis discussed: not indicated. Follow up in 4 weeks. 50% of 20 min visit spent on counseling and coordination of care.     Terri Miller CNM 01/09/2018

## 2018-01-09 NOTE — Patient Instructions (Signed)
preg sec tri  Second Trimester of Pregnancy The second trimester is from week 14 through week 27 (months 4 through 6). The second trimester is often a time when you feel your best. Your body has adjusted to being pregnant, and you begin to feel better physically. Usually, morning sickness has lessened or quit completely, you may have more energy, and you may have an increase in appetite. The second trimester is also a time when the fetus is growing rapidly. At the end of the sixth month, the fetus is about 9 inches long and weighs about 1 pounds. You will likely begin to feel the baby move (quickening) between 16 and 20 weeks of pregnancy. Body changes during your second trimester Your body continues to go through many changes during your second trimester. The changes vary from woman to woman.  Your weight will continue to increase. You will notice your lower abdomen bulging out.  You may begin to get stretch marks on your hips, abdomen, and breasts.  You may develop headaches that can be relieved by medicines. The medicines should be approved by your health care provider.  You may urinate more often because the fetus is pressing on your bladder.  You may develop or continue to have heartburn as a result of your pregnancy.  You may develop constipation because certain hormones are causing the muscles that push waste through your intestines to slow down.  You may develop hemorrhoids or swollen, bulging veins (varicose veins).  You may have back pain. This is caused by: ? Weight gain. ? Pregnancy hormones that are relaxing the joints in your pelvis. ? A shift in weight and the muscles that support your balance.  Your breasts will continue to grow and they will continue to become tender.  Your gums may bleed and may be sensitive to brushing and flossing.  Dark spots or blotches (chloasma, mask of pregnancy) may develop on your face. This will likely fade after the baby is born.  A dark  line from your belly button to the pubic area (linea nigra) may appear. This will likely fade after the baby is born.  You may have changes in your hair. These can include thickening of your hair, rapid growth, and changes in texture. Some women also have hair loss during or after pregnancy, or hair that feels dry or thin. Your hair will most likely return to normal after your baby is born.  What to expect at prenatal visits During a routine prenatal visit:  You will be weighed to make sure you and the fetus are growing normally.  Your blood pressure will be taken.  Your abdomen will be measured to track your baby's growth.  The fetal heartbeat will be listened to.  Any test results from the previous visit will be discussed.  Your health care provider may ask you:  How you are feeling.  If you are feeling the baby move.  If you have had any abnormal symptoms, such as leaking fluid, bleeding, severe headaches, or abdominal cramping.  If you are using any tobacco products, including cigarettes, chewing tobacco, and electronic cigarettes.  If you have any questions.  Other tests that may be performed during your second trimester include:  Blood tests that check for: ? Low iron levels (anemia). ? High blood sugar that affects pregnant women (gestational diabetes) between 2224 and 28 weeks. ? Rh antibodies. This is to check for a protein on red blood cells (Rh factor).  Urine tests to check  for infections, diabetes, or protein in the urine.  An ultrasound to confirm the proper growth and development of the baby.  An amniocentesis to check for possible genetic problems.  Fetal screens for spina bifida and Down syndrome.  HIV (human immunodeficiency virus) testing. Routine prenatal testing includes screening for HIV, unless you choose not to have this test.  Follow these instructions at home: Medicines  Follow your health care provider's instructions regarding medicine use.  Specific medicines may be either safe or unsafe to take during pregnancy.  Take a prenatal vitamin that contains at least 600 micrograms (mcg) of folic acid.  If you develop constipation, try taking a stool softener if your health care provider approves. Eating and drinking  Eat a balanced diet that includes fresh fruits and vegetables, whole grains, good sources of protein such as meat, eggs, or tofu, and low-fat dairy. Your health care provider will help you determine the amount of weight gain that is right for you.  Avoid raw meat and uncooked cheese. These carry germs that can cause birth defects in the baby.  If you have low calcium intake from food, talk to your health care provider about whether you should take a daily calcium supplement.  Limit foods that are high in fat and processed sugars, such as fried and sweet foods.  To prevent constipation: ? Drink enough fluid to keep your urine clear or pale yellow. ? Eat foods that are high in fiber, such as fresh fruits and vegetables, whole grains, and beans. Activity  Exercise only as directed by your health care provider. Most women can continue their usual exercise routine during pregnancy. Try to exercise for 30 minutes at least 5 days a week. Stop exercising if you experience uterine contractions.  Avoid heavy lifting, wear low heel shoes, and practice good posture.  A sexual relationship may be continued unless your health care provider directs you otherwise. Relieving pain and discomfort  Wear a good support bra to prevent discomfort from breast tenderness.  Take warm sitz baths to soothe any pain or discomfort caused by hemorrhoids. Use hemorrhoid cream if your health care provider approves.  Rest with your legs elevated if you have leg cramps or low back pain.  If you develop varicose veins, wear support hose. Elevate your feet for 15 minutes, 3-4 times a day. Limit salt in your diet. Prenatal Care  Write down your  questions. Take them to your prenatal visits.  Keep all your prenatal visits as told by your health care provider. This is important. Safety  Wear your seat belt at all times when driving.  Make a list of emergency phone numbers, including numbers for family, friends, the hospital, and police and fire departments. General instructions  Ask your health care provider for a referral to a local prenatal education class. Begin classes no later than the beginning of month 6 of your pregnancy.  Ask for help if you have counseling or nutritional needs during pregnancy. Your health care provider can offer advice or refer you to specialists for help with various needs.  Do not use hot tubs, steam rooms, or saunas.  Do not douche or use tampons or scented sanitary pads.  Do not cross your legs for long periods of time.  Avoid cat litter boxes and soil used by cats. These carry germs that can cause birth defects in the baby and possibly loss of the fetus by miscarriage or stillbirth.  Avoid all smoking, herbs, alcohol, and unprescribed drugs. Chemicals  in these products can affect the formation and growth of the baby.  Do not use any products that contain nicotine or tobacco, such as cigarettes and e-cigarettes. If you need help quitting, ask your health care provider.  Visit your dentist if you have not gone yet during your pregnancy. Use a soft toothbrush to brush your teeth and be gentle when you floss. Contact a health care provider if:  You have dizziness.  You have mild pelvic cramps, pelvic pressure, or nagging pain in the abdominal area.  You have persistent nausea, vomiting, or diarrhea.  You have a bad smelling vaginal discharge.  You have pain when you urinate. Get help right away if:  You have a fever.  You are leaking fluid from your vagina.  You have spotting or bleeding from your vagina.  You have severe abdominal cramping or pain.  You have rapid weight gain or  weight loss.  You have shortness of breath with chest pain.  You notice sudden or extreme swelling of your face, hands, ankles, feet, or legs.  You have not felt your baby move in over an hour.  You have severe headaches that do not go away when you take medicine.  You have vision changes. Summary  The second trimester is from week 14 through week 27 (months 4 through 6). It is also a time when the fetus is growing rapidly.  Your body goes through many changes during pregnancy. The changes vary from woman to woman.  Avoid all smoking, herbs, alcohol, and unprescribed drugs. These chemicals affect the formation and growth your baby.  Do not use any tobacco products, such as cigarettes, chewing tobacco, and e-cigarettes. If you need help quitting, ask your health care provider.  Contact your health care provider if you have any questions. Keep all prenatal visits as told by your health care provider. This is important. This information is not intended to replace advice given to you by your health care provider. Make sure you discuss any questions you have with your health care provider. Document Released: 05/08/2001 Document Revised: 06/19/2016 Document Reviewed: 06/19/2016 Elsevier Interactive Patient Education  [employer].

## 2018-01-10 LAB — POCT URINALYSIS DIP (DEVICE)
Bilirubin Urine: NEGATIVE
Glucose, UA: NEGATIVE mg/dL
HGB URINE DIPSTICK: NEGATIVE
Ketones, ur: NEGATIVE mg/dL
Nitrite: NEGATIVE
PROTEIN: NEGATIVE mg/dL
SPECIFIC GRAVITY, URINE: 1.015 (ref 1.005–1.030)
UROBILINOGEN UA: 0.2 mg/dL (ref 0.0–1.0)
pH: 7.5 (ref 5.0–8.0)

## 2018-01-12 LAB — CULTURE, OB URINE

## 2018-01-12 LAB — URINE CULTURE, OB REFLEX

## 2018-01-17 LAB — CYSTIC FIBROSIS GENE TEST

## 2018-01-17 LAB — OBSTETRIC PANEL, INCLUDING HIV
ANTIBODY SCREEN: NEGATIVE
BASOS: 0 %
Basophils Absolute: 0 10*3/uL (ref 0.0–0.2)
EOS (ABSOLUTE): 0.2 10*3/uL (ref 0.0–0.4)
EOS: 3 %
HEMATOCRIT: 37.4 % (ref 34.0–46.6)
HEMOGLOBIN: 12 g/dL (ref 11.1–15.9)
HIV SCREEN 4TH GENERATION: NONREACTIVE
Hepatitis B Surface Ag: NEGATIVE
IMMATURE GRANS (ABS): 0.1 10*3/uL (ref 0.0–0.1)
Immature Granulocytes: 1 %
LYMPHS: 24 %
Lymphocytes Absolute: 2.3 10*3/uL (ref 0.7–3.1)
MCH: 29.3 pg (ref 26.6–33.0)
MCHC: 32.1 g/dL (ref 31.5–35.7)
MCV: 91 fL (ref 79–97)
MONOCYTES: 9 %
Monocytes Absolute: 0.9 10*3/uL (ref 0.1–0.9)
NEUTROS ABS: 6 10*3/uL (ref 1.4–7.0)
Neutrophils: 63 %
Platelets: 263 10*3/uL (ref 150–450)
RBC: 4.1 x10E6/uL (ref 3.77–5.28)
RDW: 13.9 % (ref 12.3–15.4)
RH TYPE: POSITIVE
RPR: NONREACTIVE
RUBELLA: 17.8 {index} (ref >0.99)
WBC: 9.5 10*3/uL (ref 3.4–10.8)

## 2018-01-17 LAB — HEMOGLOBINOPATHY EVALUATION
FERRITIN: 26 ng/mL (ref 15–150)
HGB C: 0 %
HGB F QUANT: 0.5 % (ref 0.0–2.0)
HGB SOLUBILITY: NEGATIVE
Hgb A2 Quant: 2.5 % (ref 1.8–3.2)
Hgb A: 97 % (ref 96.4–98.8)
Hgb S: 0 %
Hgb Variant: 0 %

## 2018-01-17 LAB — SMN1 COPY NUMBER ANALYSIS (SMA CARRIER SCREENING)

## 2018-01-22 ENCOUNTER — Encounter (HOSPITAL_COMMUNITY): Payer: Self-pay

## 2018-01-29 ENCOUNTER — Ambulatory Visit (HOSPITAL_COMMUNITY)
Admission: RE | Admit: 2018-01-29 | Discharge: 2018-01-29 | Disposition: A | Discharge status: Home / Self Care | Payer: Medicaid Other | Source: Ambulatory Visit | Attending: Obstetrics and Gynecology | Admitting: Obstetrics and Gynecology

## 2018-01-29 DIAGNOSIS — Z3A17 17 weeks gestation of pregnancy: Secondary | ICD-10-CM | POA: Diagnosis not present

## 2018-01-29 DIAGNOSIS — Z363 Encounter for antenatal screening for malformations: Secondary | ICD-10-CM | POA: Diagnosis not present

## 2018-01-29 DIAGNOSIS — Z3482 Encounter for supervision of other normal pregnancy, second trimester: Secondary | ICD-10-CM | POA: Diagnosis not present

## 2018-01-29 DIAGNOSIS — Z3480 Encounter for supervision of other normal pregnancy, unspecified trimester: Secondary | ICD-10-CM

## 2018-01-30 ENCOUNTER — Other Ambulatory Visit (HOSPITAL_COMMUNITY): Payer: Self-pay | Admitting: *Deleted

## 2018-01-30 DIAGNOSIS — Z362 Encounter for other antenatal screening follow-up: Secondary | ICD-10-CM

## 2018-02-06 ENCOUNTER — Ambulatory Visit (INDEPENDENT_AMBULATORY_CARE_PROVIDER_SITE_OTHER): Payer: Self-pay | Admitting: Advanced Practice Midwife

## 2018-02-06 VITALS — BP 104/54 | HR 76 | Wt 103.0 lb

## 2018-02-06 DIAGNOSIS — Z789 Other specified health status: Secondary | ICD-10-CM

## 2018-02-06 DIAGNOSIS — Z3482 Encounter for supervision of other normal pregnancy, second trimester: Secondary | ICD-10-CM

## 2018-02-06 NOTE — Progress Notes (Signed)
   PRENATAL VISIT NOTE  Subjective:  Terri Miller is a 27 y.o. [id number] at 2752w5d being seen today for ongoing prenatal care.  She is currently monitored for the following issues for this low-risk pregnancy and has Supervision of other normal pregnancy, antepartum and Language barrier on their problem list.  Patient reports no complaints.  Contractions: Not present. Vag. Bleeding: None.  Movement: Present. Denies leaking of fluid.   The following portions of the patient's history were reviewed and updated as appropriate: allergies, current medications, past family history, past medical history, past social history, past surgical history and problem list. Problem list updated.  Objective:   Vitals:   02/06/18 1423  BP: (!) 104/54  Pulse: 76  Weight: 103 lb (46.7 kg)    Fetal Status: Fetal Heart Rate (bpm): 164   Movement: Present     General:  Alert, oriented and cooperative. Patient is in no acute distress.  Skin: Skin is warm and dry. No rash noted.   Cardiovascular: Normal heart rate noted  Respiratory: Normal respiratory effort, no problems with respiration noted  Abdomen: Soft, gravid, appropriate for gestational age.  Pain/Pressure: Present     Pelvic: Cervical exam deferred        Extremities: Normal range of motion.  Edema: None  Mental Status: Normal mood and affect. Normal behavior. Normal judgment and thought content.   Assessment and Plan:  Pregnancy: [id number] at 6352w5d  1. Encounter for supervision of other normal pregnancy in second trimester --No complaints or concerns, continue routine care --Confirmed patient has follow up US to complete fetal anatomy screening on 02/26/18  2. Language barrier --Contract interpreter Sullivan LoneGilbert present for entire visit --Patient plans to use husband as interpreter for future visits.   Preterm labor symptoms and general obstetric precautions including but not limited to vaginal bleeding, contractions, leaking of fluid and  fetal movement were reviewed in detail with the patient. Please refer to After Visit Summary for other counseling recommendations.  Return in about 4 weeks (around 03/06/2018).  Future Appointments  Date Time Provider Department Center  02/26/2018  1:30 PM [facility] US 1 [facility]    Calvert CantorSamantha C Aqib Lough, CNM 02/06/18  2:38 PM

## 2018-02-06 NOTE — Patient Instructions (Signed)
Second Trimester of Pregnancy The second trimester is from week 13 through week 28, month 4 through 6. This is often the time in pregnancy that you feel your best. Often times, morning sickness has lessened or quit. You may have more energy, and you may get hungry more often. Your unborn baby (fetus) is growing rapidly. At the end of the sixth month, he or she is about 9 inches long and weighs about 1 pounds. You will likely feel the baby move (quickening) between 18 and 20 weeks of pregnancy.  Research childbirth classes and hospital preregistration at [email or web address]  Follow these instructions at home:  Avoid all smoking, herbs, and alcohol. Avoid drugs not approved by your doctor.  Do not use any tobacco products, including cigarettes, chewing tobacco, and electronic cigarettes. If you need help quitting, ask your doctor. You may get counseling or other support to help you quit.  Only take medicine as told by your doctor. Some medicines are safe and some are not during pregnancy.  Exercise only as told by your doctor. Stop exercising if you start having cramps.  Eat regular, healthy meals.  Wear a good support bra if your breasts are tender.  Do not use hot tubs, steam rooms, or saunas.  Wear your seat belt when driving.  Avoid raw meat, uncooked cheese, and liter boxes and soil used by cats.  Take your prenatal vitamins.  Take 1500-2000 milligrams of calcium daily starting at the 20th week of pregnancy until you deliver your baby.  Try taking medicine that helps you poop (stool softener) as needed, and if your doctor approves. Eat more fiber by eating fresh fruit, vegetables, and whole grains. Drink enough fluids to keep your pee (urine) clear or pale yellow.  Take warm water baths (sitz baths) to soothe pain or discomfort caused by hemorrhoids. Use hemorrhoid cream if your doctor approves.  If you have puffy, bulging veins (varicose veins), wear support hose. Raise  (elevate) your feet for 15 minutes, 3-4 times a day. Limit salt in your diet.  Avoid heavy lifting, wear low heals, and sit up straight.  Rest with your legs raised if you have leg cramps or low back pain.  Visit your dentist if you have not gone during your pregnancy. Use a soft toothbrush to brush your teeth. Be gentle when you floss.  You can have sex (intercourse) unless your doctor tells you not to.  Go to your doctor visits.  Get help if:  You feel dizzy.  You have mild cramps or pressure in your lower belly (abdomen).  You have a nagging pain in your belly area.  You continue to feel sick to your stomach (nauseous), throw up (vomit), or have watery poop (diarrhea).  You have bad smelling fluid coming from your vagina.  You have pain with peeing (urination). Get help right away if:  You have a fever.  You are leaking fluid from your vagina.  You have spotting or bleeding from your vagina.  You have severe belly cramping or pain.  You lose or gain weight rapidly.  You have trouble catching your breath and have chest pain.  You notice sudden or extreme puffiness (swelling) of your face, hands, ankles, feet, or legs.  You have not felt the baby move in over an hour.  You have severe headaches that do not go away with medicine.  You have vision changes. This information is not intended to replace advice given to you by your health care provider. Make   sure you discuss any questions you have with your health care provider. Document Released: 08/08/2009 Document Revised: 10/20/2015 Document Reviewed: 07/15/2012 Elsevier Interactive Patient Education  2017 Elsevier Inc.    

## 2018-02-06 NOTE — Progress Notes (Signed)
Pt states did not want Interpreter, states her husband can assist.

## 2018-02-15 ENCOUNTER — Inpatient Hospital Stay (HOSPITAL_COMMUNITY)
Admission: AD | Admit: 2018-02-15 | Discharge: 2018-02-15 | Disposition: A | Discharge status: Home / Self Care | Payer: Medicaid Other | Source: Ambulatory Visit | Attending: Obstetrics & Gynecology | Admitting: Obstetrics & Gynecology

## 2018-02-15 ENCOUNTER — Other Ambulatory Visit: Payer: Self-pay

## 2018-02-15 ENCOUNTER — Encounter (HOSPITAL_COMMUNITY): Payer: Self-pay

## 2018-02-15 ENCOUNTER — Inpatient Hospital Stay (HOSPITAL_BASED_OUTPATIENT_CLINIC_OR_DEPARTMENT_OTHER): Payer: Medicaid Other

## 2018-02-15 DIAGNOSIS — Z3A20 20 weeks gestation of pregnancy: Secondary | ICD-10-CM | POA: Diagnosis not present

## 2018-02-15 DIAGNOSIS — R109 Unspecified abdominal pain: Secondary | ICD-10-CM | POA: Diagnosis not present

## 2018-02-15 DIAGNOSIS — O26892 Other specified pregnancy related conditions, second trimester: Secondary | ICD-10-CM | POA: Diagnosis not present

## 2018-02-15 DIAGNOSIS — O26899 Other specified pregnancy related conditions, unspecified trimester: Secondary | ICD-10-CM

## 2018-02-15 DIAGNOSIS — R110 Nausea: Secondary | ICD-10-CM

## 2018-02-15 LAB — COMPREHENSIVE METABOLIC PANEL
ALBUMIN: 3.6 g/dL (ref 3.5–5.0)
ALK PHOS: 55 U/L (ref 38–126)
ALT: 11 U/L (ref 0–44)
ANION GAP: 10 (ref 5–15)
AST: 15 U/L (ref 15–41)
BUN: 6 mg/dL (ref 6–20)
CALCIUM: 8.7 mg/dL — AB (ref 8.9–10.3)
CO2: 20 mmol/L — AB (ref 22–32)
Chloride: 107 mmol/L (ref 98–111)
Creatinine, Ser: 0.53 mg/dL (ref 0.44–1.00)
GFR calc non Af Amer: >60 mL/min (ref >60)
GLUCOSE: 64 mg/dL — AB (ref 70–99)
POTASSIUM: 3.7 mmol/L (ref 3.5–5.1)
SODIUM: 137 mmol/L (ref 135–145)
TOTAL PROTEIN: 6.6 g/dL (ref 6.5–8.1)
Total Bilirubin: 1 mg/dL (ref 0.3–1.2)

## 2018-02-15 LAB — CBC WITH DIFFERENTIAL/PLATELET
BASOS PCT: 0 %
Basophils Absolute: 0 10*3/uL (ref 0.0–0.1)
EOS ABS: 0.1 10*3/uL (ref 0.0–0.7)
EOS PCT: 1 %
HCT: 36.4 % (ref 36.0–46.0)
Hemoglobin: 12.5 g/dL (ref 12.0–15.0)
LYMPHS ABS: 1.7 10*3/uL (ref 0.7–4.0)
Lymphocytes Relative: 21 %
MCH: 30.7 pg (ref 26.0–34.0)
MCHC: 34.3 g/dL (ref 30.0–36.0)
MCV: 89.4 fL (ref 78.0–100.0)
MONO ABS: 0.3 10*3/uL (ref 0.1–1.0)
MONOS PCT: 4 %
Neutro Abs: 6 10*3/uL (ref 1.7–7.7)
Neutrophils Relative %: 74 %
Platelets: 237 10*3/uL (ref 150–400)
RBC: 4.07 MIL/uL (ref 3.87–5.11)
RDW: 13.6 % (ref 11.5–15.5)
WBC: 8.2 10*3/uL (ref 4.0–10.5)

## 2018-02-15 LAB — URINALYSIS, ROUTINE W REFLEX MICROSCOPIC
BILIRUBIN URINE: NEGATIVE
GLUCOSE, UA: NEGATIVE mg/dL
HGB URINE DIPSTICK: NEGATIVE
KETONES UR: 80 mg/dL — AB
NITRITE: NEGATIVE
PH: 8 (ref 5.0–8.0)
Protein, ur: 30 mg/dL — AB
SPECIFIC GRAVITY, URINE: 1.021 (ref 1.005–1.030)
Squamous Epithelial / LPF: >50 — ABNORMAL HIGH (ref 0–5)
WBC, UA: >50 WBC/hpf — ABNORMAL HIGH (ref 0–5)

## 2018-02-15 LAB — TSH: TSH: 0.279 u[IU]/mL — ABNORMAL LOW (ref 0.350–4.500)

## 2018-02-15 MED ORDER — KETOROLAC TROMETHAMINE 60 MG/2ML IM SOLN
60.0000 mg | Freq: Once | INTRAMUSCULAR | Status: AC
  Administered 2018-02-15: 60 mg via INTRAMUSCULAR
  Filled 2018-02-15: qty 2

## 2018-02-15 MED ORDER — ONDANSETRON 8 MG PO TBDP: 8.0000 mg | ORAL_TABLET | Freq: Three times a day (TID) | ORAL | 0 refills | Status: DC | PRN

## 2018-02-15 MED ORDER — DEXTROSE 5 % IN LACTATED RINGERS IV BOLUS
1000.0000 mL | Freq: Once | INTRAVENOUS | Status: AC
  Administered 2018-02-15: 1000 mL via INTRAVENOUS

## 2018-02-15 MED ORDER — LACTATED RINGERS IV BOLUS
1000.0000 mL | Freq: Once | INTRAVENOUS | Status: AC
  Administered 2018-02-15: 1000 mL via INTRAVENOUS

## 2018-02-15 NOTE — MAU Provider Note (Signed)
History     CSN: [id number]  Arrival date and time: 02/15/18 1000   First Provider Initiated Contact with Patient 02/15/18 1108      Chief Complaint  Patient presents with  . Abdominal Pain   HPI   [place] interpretor used:  [name] is a 27 y.o. female [phone] @ 24w0dhere in MAU with complaints of abdominal pain. This is a new problem. The pain started 2 days ago. The pain comes and goes. The pain is located in the lower part of her abdomen. She denies bleeding. She has not taken any pain medication. History of 2 first trimester miscarriage. Says she not had anything to drink today because she does not have the urge to drink.   OB History    Gravida  4   Para  2   Term  2   Preterm  0   AB  1   Living  2     SAB  1   TAB  0   Ectopic  0   Multiple  0   Live Births  1           Past Medical History:  Diagnosis Date  . Medical history non-contributory     Past Surgical History:  Procedure Laterality Date  . NO PAST SURGERIES      History reviewed. No pertinent family history.  Social History   Tobacco Use  . Smoking status: Never Smoker  . Smokeless tobacco: Never Used  Substance Use Topics  . Alcohol use: No  . Drug use: No    Allergies: No Known Allergies  Medications Prior to Admission  Medication Sig Dispense Refill Last Dose  . acetaminophen (TYLENOL) 325 MG tablet Take 650 mg by mouth every 6 (six) hours as needed.   Taking  . ibuprofen (ADVIL,MOTRIN) 600 MG tablet Take 1 tablet (600 mg total) by mouth every 6 (six) hours as needed. (Patient not taking: Reported on 02/06/2018) 30 tablet 0 Not Taking  . IRON PO Take 1 tablet by mouth daily. otc medication, pt not sure of dosage   Taking  . Prenatal Multivit-Min-Fe-FA (PRENATAL VITAMINS) 0.8 MG tablet Take 1 tablet by mouth daily. 30 tablet 12 Taking   Results for orders placed or performed during the hospital encounter of 02/15/18 (from the past 48 hour(s))   Urinalysis, Routine w reflex microscopic     Status: Abnormal   Collection Time: 02/15/18 10:29 AM  Result Value Ref Range   Color, Urine YELLOW YELLOW   APPearance CLOUDY (A) CLEAR   Specific Gravity, Urine 1.021 1.005 - 1.030   pH 8.0 5.0 - 8.0   Glucose, UA NEGATIVE NEGATIVE mg/dL   Hgb urine dipstick NEGATIVE NEGATIVE   Bilirubin Urine NEGATIVE NEGATIVE   Ketones, ur 80 (A) NEGATIVE mg/dL   Protein, ur 30 (A) NEGATIVE mg/dL   Nitrite NEGATIVE NEGATIVE   Leukocytes, UA LARGE (A) NEGATIVE   RBC / HPF 0-5 0 - 5 RBC/hpf   WBC, UA >50 (H) 0 - 5 WBC/hpf   Bacteria, UA FEW (A) NONE SEEN   Squamous Epithelial / LPF >50 (H) 0 - 5   Mucus PRESENT     Comment: [facility] [address], [place] [id number] CBC with Differential     Status: None   Collection Time: 02/15/18 11:42 AM  Result Value Ref Range   WBC 8.2 4.0 - 10.5 K/uL   RBC 4.07 3.87 - 5.11 MIL/uL  Hemoglobin 12.5 12.0 - 15.0 g/dL   HCT 36.4 36.0 - 46.0 %   MCV 89.4 78.0 - 100.0 fL   MCH 30.7 26.0 - 34.0 pg   MCHC 34.3 30.0 - 36.0 g/dL   RDW 13.6 11.5 - 15.5 %   Platelets 237 150 - 400 K/uL   Neutrophils Relative % 74 %   Neutro Abs 6.0 1.7 - 7.7 K/uL   Lymphocytes Relative 21 %   Lymphs Abs 1.7 0.7 - 4.0 K/uL   Monocytes Relative 4 %   Monocytes Absolute 0.3 0.1 - 1.0 K/uL   Eosinophils Relative 1 %   Eosinophils Absolute 0.1 0.0 - 0.7 K/uL   Basophils Relative 0 %   Basophils Absolute 0.0 0.0 - 0.1 K/uL    Comment: Performed at [facility], [address] [place], [place] [address]  Comprehensive metabolic panel     Status: Abnormal   Collection Time: 02/15/18 11:42 AM  Result Value Ref Range   Sodium 137 135 - 145 mmol/L   Potassium 3.7 3.5 - 5.1 mmol/L   Chloride 107 98 - 111 mmol/L   CO2 20 (L) 22 - 32 mmol/L   Glucose, Bld 64 (L) 70 - 99 mg/dL   BUN 6 6 - 20 mg/dL   Creatinine, Ser 0.53 0.44 - 1.00 mg/dL   Calcium 8.7 (L) 8.9 - 10.3 mg/dL   Total Protein 6.6 6.5 -  8.1 g/dL   Albumin 3.6 3.5 - 5.0 g/dL   AST 15 15 - 41 U/L   ALT 11 0 - 44 U/L   Alkaline Phosphatase 55 38 - 126 U/L   Total Bilirubin 1.0 0.3 - 1.2 mg/dL   GFR calc non Af Amer >60 >60 mL/min   GFR calc Af Amer >60 >60 mL/min    Comment: (NOTE) The eGFR has been calculated using the CKD EPI equation. This calculation has not been validated in all clinical situations. eGFR's persistently <60 mL/min signify possible Chronic Kidney Disease.    Anion gap 10 5 - 15    Comment: Performed at [facility], [address], [place], [place] [address]  TSH     Status: Abnormal   Collection Time: 02/15/18  3:03 PM  Result Value Ref Range   TSH 0.279 (L) 0.350 - 4.500 uIU/mL    Comment: Performed by a 3rd Generation assay with a functional sensitivity of <=0.01 uIU/mL. Performed at [facility], [address], [place], [place] [address]    No results found.   Review of Systems  Constitutional: Negative for fever.  Gastrointestinal: Positive for abdominal pain and nausea. Negative for diarrhea and vomiting.  Genitourinary: Negative for dysuria, vaginal bleeding and vaginal discharge.   Physical Exam   Blood pressure 103/61, pulse 93, temperature 98 F (36.7 C), temperature source Oral, resp. rate 16, weight 43.1 kg, last menstrual period 09/28/2017, SpO2 100 %, unknown if currently breastfeeding.  Physical Exam  Constitutional: She is oriented to person, place, and time. She appears well-developed and well-nourished. No distress.  GI: Soft. Normal appearance. There is tenderness in the right lower quadrant and suprapubic area. There is guarding. There is no rigidity and no rebound.  Musculoskeletal: Normal range of motion.  Neurological: She is alert and oriented to person, place, and time.  Skin: Skin is warm. She is diaphoretic.  Psychiatric: Her affect is labile. She is withdrawn.   MAU Course  Procedures  None  MDM  + fetal heart tones via doppler  Urine culture.  UA shows > 80 ketones D5LR bolus x 1 Lr bolus x1 US OB limited shows cervical length of >4 cm  Toradol 60 mg IM given Patient rates her pain at 1425: 0/10 Patient without rebound, leukocytosis, or fever.  TSH with Free T3 & T4 added   Assessment and Plan   A:  1. Nausea   2. Abdominal pain in pregnancy   3. [redacted] weeks gestation of pregnancy     P:  Discharge home in stable condition Rx: Zofran  Return to MAU if symptoms worsen  Increase oral fluids, small frequent meals F/U in the clinic    Leydy Worthey, Artist Pais, NP 02/15/2018 4:55 PM

## 2018-02-15 NOTE — MAU Note (Signed)
Upon dc, pt. B/p was low.  Pt was told that bp needed to be rechecked before dc.  Pt stated she'd like use restroom first.  Pt. Did not return to her room after using restroom.  AMA papers not signed.

## 2018-02-15 NOTE — MAU Note (Signed)
Terri Miller is a 27 y.o. at 924w0d here in MAU reporting: abdominal pain. Onset of complaint: started 2 days ago. Intermittent. Feels like menstrual cramps.  Pain score: 10/10 when the pain happens Denies vaginal bleeding or discharge. Does report feeling dizzy. Vitals:   02/15/18 1023  BP: 103/61  Pulse: 93  Resp: 16  Temp: 98 F (36.7 C)  SpO2: 100%     Lab orders placed from triage: ua [place] interpretor used for triage. Language: [place]

## 2018-02-15 NOTE — Discharge Instructions (Signed)
Nausea, Adult Feeling sick to your stomach (nausea) means that your stomach is upset or you feel like you have to throw up (vomit). Feeling sick to your stomach is usually not serious, but it may be an early sign of a more serious medical problem. As you feel sicker to your stomach, it can lead to throwing up (vomiting). If you throw up, or if you are not able to drink enough fluids, there is a risk of dehydration. Dehydration can make you feel tired and thirsty, have a dry mouth, and pee (urinate) less often. Older adults and people who have other diseases or a weak defense (immune) system have a higher risk of dehydration. The main goal of treating this condition is to:  Limit how often you feel sick to your stomach.  Prevent throwing up and dehydration.  Follow these instructions at home: Follow instructions from your doctor about how to care for yourself at home. Eating and drinking Follow these recommendations as told by your doctor:  Take an oral rehydration solution (ORS). This is a drink that is sold at pharmacies and stores.  Drink clear fluids in small amounts as you are able, such as: ? Water. ? Ice chips. ? Fruit juice that has water added (diluted fruit juice). ? Low-calorie sports drinks.  Eat bland, easy to digest foods in small amounts as you are able, such as: ? Bananas. ? Applesauce. ? Rice. ? Lean meats. ? Toast. ? Crackers.  Avoid drinking fluids that contain a lot of sugar or caffeine.  Avoid alcohol.  Avoid spicy or fatty foods.  General instructions  Drink enough fluid to keep your pee (urine) clear or pale yellow.  Wash your hands often. If you cannot use soap and water, use hand sanitizer.  Make sure that all people in your household wash their hands well and often.  Rest at home while you get better.  Take over-the-counter and prescription medicines only as told by your doctor.  Breathe slowly and deeply when you feel sick to your  stomach.  Watch your condition for any changes.  Keep all follow-up visits as told by your doctor. This is important. Contact a doctor if:  You have a headache.  You have new symptoms.  You feel sicker to your stomach.  You have a fever.  You feel light-headed or dizzy.  You throw up.  You are not able to keep fluids down. Get help right away if:  You have pain in your chest, neck, arm, or jaw.  You feel very weak or you pass out (faint).  You have throw up that is bright red or looks like coffee grounds.  You have bloody or black poop (stools), or poop that looks like tar.  You have a very bad headache, a stiff neck, or both.  You have very bad pain, cramping, or bloating in your belly.  You have a rash.  You have trouble breathing or you are breathing very quickly.  Your heart is beating very quickly.  Your skin feels cold and clammy.  You feel confused.  You have pain while peeing.  You have signs of dehydration, such as: ? Dark pee, or very little or no pee. ? Cracked lips. ? Dry mouth. ? Sunken eyes. ? Sleepiness. ? Weakness. These symptoms may be an emergency. Do not wait to see if the symptoms will go away. Get medical help right away. Call your local emergency services (911 in the [place] Do not drive yourself to   the hospital. This information is not intended to replace advice given to you by your health care provider. Make sure you discuss any questions you have with your health care provider. Document Released: 05/03/2011 Document Revised: 10/20/2015 Document Reviewed: 01/18/2015 Elsevier Interactive Patient Education  2018 Elsevier Inc[employer]  

## 2018-02-16 LAB — CULTURE, OB URINE: Special Requests: NORMAL

## 2018-02-26 ENCOUNTER — Ambulatory Visit (HOSPITAL_COMMUNITY)
Admission: RE | Admit: 2018-02-26 | Discharge: 2018-02-26 | Disposition: A | Discharge status: Home / Self Care | Payer: Medicaid Other | Source: Ambulatory Visit | Attending: Obstetrics and Gynecology | Admitting: Obstetrics and Gynecology

## 2018-02-26 DIAGNOSIS — Z3A21 21 weeks gestation of pregnancy: Secondary | ICD-10-CM | POA: Diagnosis not present

## 2018-02-26 DIAGNOSIS — Z362 Encounter for other antenatal screening follow-up: Secondary | ICD-10-CM | POA: Diagnosis not present

## 2018-03-06 ENCOUNTER — Ambulatory Visit (INDEPENDENT_AMBULATORY_CARE_PROVIDER_SITE_OTHER): Payer: Medicaid Other | Admitting: Obstetrics and Gynecology

## 2018-03-06 VITALS — BP 95/51 | HR 89 | Wt 104.0 lb

## 2018-03-06 DIAGNOSIS — Z3482 Encounter for supervision of other normal pregnancy, second trimester: Secondary | ICD-10-CM | POA: Diagnosis not present

## 2018-03-06 DIAGNOSIS — Z23 Encounter for immunization: Secondary | ICD-10-CM | POA: Diagnosis not present

## 2018-03-06 DIAGNOSIS — O219 Vomiting of pregnancy, unspecified: Secondary | ICD-10-CM | POA: Diagnosis not present

## 2018-03-06 DIAGNOSIS — Z3480 Encounter for supervision of other normal pregnancy, unspecified trimester: Secondary | ICD-10-CM

## 2018-03-06 NOTE — Progress Notes (Signed)
   PRENATAL VISIT NOTE  Subjective:  Terri Miller is a 27 y.o. [id number] at [redacted]w[redacted]d being seen today for ongoing prenatal care.  She is currently monitored for the following issues for this low-risk pregnancy and has Supervision of other normal pregnancy, antepartum and Language barrier on their problem list.  Patient reports no complaints.  Contractions: Not present. Vag. Bleeding: None.  Movement: Present. Denies leaking of fluid.   The following portions of the patient's history were reviewed and updated as appropriate: allergies, current medications, past family history, past medical history, past social history, past surgical history and problem list. Problem list updated.  Objective:   Vitals:   03/06/18 1348  BP: (!) 95/51  Pulse: 89  Weight: 104 lb (47.2 kg)    Fetal Status: Fetal Heart Rate (bpm): 158 Fundal Height: 20 cm Movement: Present     General:  Alert, oriented and cooperative. Patient is in no acute distress.  Skin: Skin is warm and dry. No rash noted.   Cardiovascular: Normal heart rate noted  Respiratory: Normal respiratory effort, no problems with respiration noted  Abdomen: Soft, gravid, appropriate for gestational age.  Pain/Pressure: Present     Pelvic: Cervical exam deferred        Extremities: Normal range of motion.  Edema: None  Mental Status: Normal mood and affect. Normal behavior. Normal judgment and thought content.   Assessment and Plan:  Pregnancy: [id number] at [redacted]w[redacted]d  1. Encounter for supervision of other normal pregnancy in second trimester - Flu Vaccine QUAD 36+ mos IM  2. Supervision of other normal pregnancy, antepartum  3. Nausea and vomiting in pregnancy -TSH in MAU 0.279 - Thyroid Panel With TSH today - N/V  resolved  Preterm labor symptoms and general obstetric precautions including but not limited to vaginal bleeding, contractions, leaking of fluid and fetal movement were reviewed in detail with the patient. Please refer to  After Visit Summary for other counseling recommendations.  No follow-ups on file.  No future appointments.  Venia Carbon, NP

## 2018-03-07 LAB — THYROID PANEL WITH TSH
FREE THYROXINE INDEX: 1.4 (ref 1.2–4.9)
T3 Uptake Ratio: 20 % — ABNORMAL LOW (ref 24–39)
T4, Total: 6.8 ug/dL (ref 4.5–12.0)
TSH: 0.784 u[IU]/mL (ref 0.450–4.500)

## 2018-03-10 ENCOUNTER — Encounter: Payer: Self-pay | Admitting: Family Medicine

## 2018-04-01 ENCOUNTER — Encounter: Payer: Medicaid Other | Admitting: Obstetrics and Gynecology

## 2018-04-14 ENCOUNTER — Ambulatory Visit (INDEPENDENT_AMBULATORY_CARE_PROVIDER_SITE_OTHER): Payer: Medicaid Other | Admitting: Advanced Practice Midwife

## 2018-04-14 ENCOUNTER — Encounter: Payer: Self-pay | Admitting: Advanced Practice Midwife

## 2018-04-14 VITALS — BP 99/55 | HR 69 | Wt 110.4 lb

## 2018-04-14 DIAGNOSIS — O26843 Uterine size-date discrepancy, third trimester: Secondary | ICD-10-CM

## 2018-04-14 DIAGNOSIS — Z3483 Encounter for supervision of other normal pregnancy, third trimester: Secondary | ICD-10-CM

## 2018-04-14 DIAGNOSIS — Z3A28 28 weeks gestation of pregnancy: Secondary | ICD-10-CM

## 2018-04-14 DIAGNOSIS — Z3480 Encounter for supervision of other normal pregnancy, unspecified trimester: Secondary | ICD-10-CM

## 2018-04-14 NOTE — Progress Notes (Signed)
   PRENATAL VISIT NOTE  Subjective:  Terri Miller is a 27 y.o. [id number] at 3356w2d being seen today for ongoing prenatal care.  She is currently monitored for the following issues for this low-risk pregnancy and has Supervision of other normal pregnancy, antepartum and Language barrier on their problem list.  Patient reports no complaints.  Contractions: Not present. Vag. Bleeding: None.  Movement: Present. Denies leaking of fluid.   The following portions of the patient's history were reviewed and updated as appropriate: allergies, current medications, past family history, past medical history, past social history, past surgical history and problem list. Problem list updated.  Objective:   Vitals:   04/14/18 1429  BP: (!) 99/55  Pulse: 69  Weight: 110 lb 6.4 oz (50.1 kg)    Fetal Status: Fetal Heart Rate (bpm): 156 Fundal Height: 25 cm Movement: Present     General:  Alert, oriented and cooperative. Patient is in no acute distress.  Skin: Skin is warm and dry. No rash noted.   Cardiovascular: Normal heart rate noted  Respiratory: Normal respiratory effort, no problems with respiration noted  Abdomen: Soft, gravid, appropriate for gestational age.  Pain/Pressure: Present     Pelvic: Cervical exam deferred        Extremities: Normal range of motion.  Edema: None  Mental Status: Normal mood and affect. Normal behavior. Normal judgment and thought content.   Assessment and Plan:  Pregnancy: [id number] at 2956w2d  1. Supervision of other normal pregnancy, antepartum - Routine care - Needs lab visit for GTT ASAP  - CBC; Future - HIV Antibody (routine testing w rflx); Future - RPR; Future - Glucose Tolerance, 2 Hours w/1 Hour; Future  2. Size < Dates - MFM US follow up  Preterm labor symptoms and general obstetric precautions including but not limited to vaginal bleeding, contractions, leaking of fluid and fetal movement were reviewed in detail with the patient. Please refer  to After Visit Summary for other counseling recommendations.  Return in about 2 weeks (around 04/28/2018).  Future Appointments  Date Time Provider Department Center  04/16/2018  4:00 PM [facility] US 3 [facility]    Thressa ShellerHeather , CNM

## 2018-04-16 ENCOUNTER — Ambulatory Visit (HOSPITAL_COMMUNITY)
Admission: RE | Admit: 2018-04-16 | Discharge: 2018-04-16 | Disposition: A | Discharge status: Home / Self Care | Payer: Medicaid Other | Source: Ambulatory Visit | Attending: Advanced Practice Midwife | Admitting: Advanced Practice Midwife

## 2018-04-16 DIAGNOSIS — Z362 Encounter for other antenatal screening follow-up: Secondary | ICD-10-CM | POA: Diagnosis present

## 2018-04-16 DIAGNOSIS — Z3A28 28 weeks gestation of pregnancy: Secondary | ICD-10-CM | POA: Diagnosis not present

## 2018-04-16 DIAGNOSIS — O26843 Uterine size-date discrepancy, third trimester: Secondary | ICD-10-CM

## 2018-04-17 ENCOUNTER — Other Ambulatory Visit: Payer: Medicaid Other

## 2018-04-17 DIAGNOSIS — Z3480 Encounter for supervision of other normal pregnancy, unspecified trimester: Secondary | ICD-10-CM

## 2018-04-18 LAB — CBC
HEMOGLOBIN: 10.9 g/dL — AB (ref 11.1–15.9)
Hematocrit: 31 % — ABNORMAL LOW (ref 34.0–46.6)
MCH: 31.2 pg (ref 26.6–33.0)
MCHC: 35.2 g/dL (ref 31.5–35.7)
MCV: 89 fL (ref 79–97)
Platelets: 200 10*3/uL (ref 150–450)
RBC: 3.49 x10E6/uL — AB (ref 3.77–5.28)
RDW: 12.4 % (ref 12.3–15.4)
WBC: 8.1 10*3/uL (ref 3.4–10.8)

## 2018-04-18 LAB — RPR: RPR Ser Ql: NONREACTIVE

## 2018-04-18 LAB — GLUCOSE TOLERANCE, 2 HOURS W/ 1HR
Glucose, 1 hour: 90 mg/dL (ref 65–179)
Glucose, 2 hour: 61 mg/dL — ABNORMAL LOW (ref 65–152)
Glucose, Fasting: 60 mg/dL — ABNORMAL LOW (ref 65–91)

## 2018-04-18 LAB — HIV ANTIBODY (ROUTINE TESTING W REFLEX): HIV Screen 4th Generation wRfx: NONREACTIVE

## 2018-04-29 ENCOUNTER — Ambulatory Visit (INDEPENDENT_AMBULATORY_CARE_PROVIDER_SITE_OTHER): Payer: Medicaid Other | Admitting: Student

## 2018-04-29 VITALS — BP 96/56 | HR 76 | Wt 113.2 lb

## 2018-04-29 DIAGNOSIS — O26849 Uterine size-date discrepancy, unspecified trimester: Secondary | ICD-10-CM | POA: Insufficient documentation

## 2018-04-29 DIAGNOSIS — O26843 Uterine size-date discrepancy, third trimester: Secondary | ICD-10-CM

## 2018-04-29 DIAGNOSIS — Z3480 Encounter for supervision of other normal pregnancy, unspecified trimester: Secondary | ICD-10-CM

## 2018-04-29 MED ORDER — FERROUS FUMARATE 325 (106 FE) MG PO TABS: 1.0000 | ORAL_TABLET | Freq: Every day | ORAL | 3 refills | Status: DC

## 2018-04-29 NOTE — Progress Notes (Signed)
   PRENATAL VISIT NOTE  Subjective:  Terri Miller is a 27 y.o. [id number] at 3471w3d being seen today for ongoing prenatal care.  She is currently monitored for the following issues for this low-risk pregnancy and has Supervision of other normal pregnancy, antepartum; Language barrier; and Uterine size date discrepancy on their problem list.  Patient reports no complaints.    Contractions: Not present. Vag. Bleeding: None.  Movement: Present. Denies leaking of fluid.   The following portions of the patient's history were reviewed and updated as appropriate: allergies, current medications, past family history, past medical history, past social history, past surgical history and problem list. Problem list updated.  Objective:   Vitals:   04/29/18 1607  BP: (!) 96/56  Pulse: 76  Weight: 113 lb 3.2 oz (51.3 kg)    Fetal Status: Fetal Heart Rate (bpm): 143 Fundal Height: 25 cm Movement: Present     General:  Alert, oriented and cooperative. Patient is in no acute distress.  Skin: Skin is warm and dry. No rash noted.   Cardiovascular: Normal heart rate noted  Respiratory: Normal respiratory effort, no problems with respiration noted  Abdomen: Soft, gravid, appropriate for gestational age.  Pain/Pressure: Absent     Pelvic: Cervical exam deferred        Extremities: Normal range of motion.  Edema: None  Mental Status: Normal mood and affect. Normal behavior. Normal judgment and thought content.   Assessment and Plan:  Pregnancy: [id number] at 6971w3d  1. Uterine size-date discrepancy in third trimester FH measuring small; will order growth scan.  - US MFM OB LIMITED; Future  2. Supervision of other normal pregnancy, antepartum -re-ordered iron, discussed how to take (avoid dairy, take with juice).  Discussed peds, updated OB Box.  -Reminded patient that she has renewals for her prenatal vitamins.     Preterm labor symptoms and general obstetric precautions including but not  limited to vaginal bleeding, contractions, leaking of fluid and fetal movement were reviewed in detail with the patient. Please refer to After Visit Summary for other counseling recommendations.  Return in about 2 weeks (around 05/13/2018), or LROB.  Future Appointments  Date Time Provider Department Center  05/13/2018 12:45 PM [facility] US 2 [facility]  05/13/2018  2:15 PM Kooistra, Charlesetta GaribaldiKathryn Lorraine, CNM [facility]    Charlesetta GaribaldiKathryn Lorraine [place], [place]

## 2018-04-29 NOTE — Patient Instructions (Signed)

## 2018-05-13 ENCOUNTER — Other Ambulatory Visit: Payer: Self-pay | Admitting: Student

## 2018-05-13 ENCOUNTER — Ambulatory Visit (INDEPENDENT_AMBULATORY_CARE_PROVIDER_SITE_OTHER): Payer: Medicaid Other | Admitting: Student

## 2018-05-13 ENCOUNTER — Ambulatory Visit (HOSPITAL_COMMUNITY)
Admission: RE | Admit: 2018-05-13 | Discharge: 2018-05-13 | Disposition: A | Discharge status: Home / Self Care | Payer: Medicaid Other | Source: Ambulatory Visit | Attending: Student | Admitting: Student

## 2018-05-13 VITALS — BP 93/54 | HR 84 | Wt 117.1 lb

## 2018-05-13 DIAGNOSIS — Z3480 Encounter for supervision of other normal pregnancy, unspecified trimester: Secondary | ICD-10-CM

## 2018-05-13 DIAGNOSIS — Z362 Encounter for other antenatal screening follow-up: Secondary | ICD-10-CM | POA: Diagnosis present

## 2018-05-13 DIAGNOSIS — O26843 Uterine size-date discrepancy, third trimester: Secondary | ICD-10-CM

## 2018-05-13 DIAGNOSIS — Z23 Encounter for immunization: Secondary | ICD-10-CM | POA: Diagnosis not present

## 2018-05-13 DIAGNOSIS — Z3483 Encounter for supervision of other normal pregnancy, third trimester: Secondary | ICD-10-CM

## 2018-05-13 DIAGNOSIS — Z3A32 32 weeks gestation of pregnancy: Secondary | ICD-10-CM | POA: Insufficient documentation

## 2018-05-13 NOTE — Progress Notes (Signed)
   PRENATAL VISIT NOTE  Subjective:  Terri Miller is a 27 y.o. [id number] at 3830w3d being seen today for ongoing prenatal care.  She is currently monitored for the following issues for this low-risk pregnancy and has Supervision of other normal pregnancy, antepartum; Language barrier; and Uterine size date discrepancy on their problem list.  Patient reports no complaints other than she would like to be fatter.   Contractions: Irregular. Vag. Bleeding: None.  Movement: Present. Denies leaking of fluid.   The following portions of the patient's history were reviewed and updated as appropriate: allergies, current medications, past family history, past medical history, past social history, past surgical history and problem list. Problem list updated.  Objective:   Vitals:   05/13/18 1341  BP: (!) 93/54  Pulse: 84  Weight: 117 lb 1.6 oz (53.1 kg)    Fetal Status: Fetal Heart Rate (bpm): 141 Fundal Height: 28 cm Movement: Present     General:  Alert, oriented and cooperative. Patient is in no acute distress.  Skin: Skin is warm and dry. No rash noted.   Cardiovascular: Normal heart rate noted  Respiratory: Normal respiratory effort, no problems with respiration noted  Abdomen: Soft, gravid, appropriate for gestational age.  Pain/Pressure: Present     Pelvic: Cervical exam deferred        Extremities: Normal range of motion.  Edema: None  Mental Status: Normal mood and affect. Normal behavior. Normal judgment and thought content.   Assessment and Plan:  Pregnancy: [id number] at 2230w3d  1. Supervision of other normal pregnancy, antepartum  - Tdap vaccine greater than or equal to 7yo IM  2. Uterine size-date discrepancy in third trimester -Patient had US for uterine size date discrepancy; fetus is in the 59%./  -Reassured patient that she is gaining enough weight and that her baby measures fine today.  -She should eat more chicken and fish if she is concerned about weight  gain  Preterm labor symptoms and general obstetric precautions including but not limited to vaginal bleeding, contractions, leaking of fluid and fetal movement were reviewed in detail with the patient. Please refer to After Visit Summary for other counseling recommendations.  Return in about 2 weeks (around 05/27/2018), or LROB.  No future appointments.  Marylene LandKathryn Lorraine Gayna Braddy, CNM

## 2018-05-28 NOTE — L&D Delivery Note (Signed)
Patient: Terri Miller MRN: [id number]  GBS status: negative, IAP given: not indicated  Patient is a 28 y.o. now [id number] s/p NSVD at [redacted]w[redacted]d, who was admitted for SOL. AROM 3h 30m prior to delivery with light meconium stained fluid.    Delivery Note At 6:44 AM a viable female was delivered via Vaginal, Spontaneous (Presentation: cephalic; LOA).  APGAR: 9, 9; weight 7 lb 12.7 oz (3535 g).   Placenta status: intact, 3-vessel cord.  Cord:  with the following complications: none.    Anesthesia:  Fentanyl  Episiotomy: None Lacerations: 2nd degree;Perineal Suture Repair: 3.0 vicryl Est. Blood Loss (mL): 104  Mom to postpartum.  Baby to Couplet care / Skin to Skin.  [name] 07/07/2018, 10:00 AM     Head delivered LOA. No nuchal cord present. Shoulder and body delivered in usual fashion. Infant with spontaneous cry, placed on mother's abdomen, dried and bulb suctioned. Cord clamped x 2 after 1-minute delay, and cut by family member. Cord blood drawn. Placenta delivered spontaneously with gentle cord traction. Fundus firm with massage and Pitocin. Perineum inspected and found to have 2nd degree perineal laceration, which was repaired with 3.0 vicryl with good hemostasis achieved.

## 2018-06-03 ENCOUNTER — Ambulatory Visit (INDEPENDENT_AMBULATORY_CARE_PROVIDER_SITE_OTHER): Payer: Medicaid Other | Admitting: Student

## 2018-06-03 ENCOUNTER — Encounter: Payer: Self-pay | Admitting: Student

## 2018-06-03 VITALS — BP 98/56 | HR 80 | Wt 118.7 lb

## 2018-06-03 DIAGNOSIS — Z3A35 35 weeks gestation of pregnancy: Secondary | ICD-10-CM

## 2018-06-03 DIAGNOSIS — Z3483 Encounter for supervision of other normal pregnancy, third trimester: Secondary | ICD-10-CM

## 2018-06-03 DIAGNOSIS — Z3480 Encounter for supervision of other normal pregnancy, unspecified trimester: Secondary | ICD-10-CM

## 2018-06-03 DIAGNOSIS — O26843 Uterine size-date discrepancy, third trimester: Secondary | ICD-10-CM

## 2018-06-03 NOTE — Progress Notes (Signed)
   PRENATAL VISIT NOTE  Subjective:  Terri Miller is a 28 y.o. [id number] at [redacted]w[redacted]d being seen today for ongoing prenatal care.  She is currently monitored for the following issues for this low-risk pregnancy and has Supervision of other normal pregnancy, antepartum; Language barrier; and Uterine size date discrepancy on their problem list.  Patient reports no complaints.  Contractions: Irritability. Vag. Bleeding: None.  Movement: Present. Denies leaking of fluid.   The following portions of the patient's history were reviewed and updated as appropriate: allergies, current medications, past family history, past medical history, past social history, past surgical history and problem list. Problem list updated.  Objective:   Vitals:   06/03/18 1602  BP: (!) 98/56  Pulse: 80  Weight: 118 lb 11.2 oz (53.8 kg)    Fetal Status: Fetal Heart Rate (bpm): 154 Fundal Height: 33 cm Movement: Present     General:  Alert, oriented and cooperative. Patient is in no acute distress.  Skin: Skin is warm and dry. No rash noted.   Cardiovascular: Normal heart rate noted  Respiratory: Normal respiratory effort, no problems with respiration noted  Abdomen: Soft, gravid, appropriate for gestational age.  Pain/Pressure: Present     Pelvic: Cervical exam deferred        Extremities: Normal range of motion.  Edema: None  Mental Status: Normal mood and affect. Normal behavior. Normal judgment and thought content.   Assessment and Plan:  Pregnancy: [id number] at [redacted]w[redacted]d   1. Supervision of other normal pregnancy, antepartum   2. Uterine size-date discrepancy in third trimester   -FH now 79, which is better than earlier measurements. Watch carefully and consider repeat Growth scan if still lagging at 36 weeks.  -Patient doing well; no other complaints.   Preterm labor symptoms and general obstetric precautions including but not limited to vaginal bleeding, contractions, leaking of fluid and fetal  movement were reviewed in detail with the patient. Please refer to After Visit Summary for other counseling recommendations.  Return in about 1 week (around 06/10/2018), or LROB.  No future appointments.  Marylene Land, CNM

## 2018-06-10 ENCOUNTER — Ambulatory Visit (INDEPENDENT_AMBULATORY_CARE_PROVIDER_SITE_OTHER): Payer: Medicaid Other | Admitting: Student

## 2018-06-10 ENCOUNTER — Other Ambulatory Visit (HOSPITAL_COMMUNITY)
Admission: RE | Admit: 2018-06-10 | Discharge: 2018-06-10 | Disposition: A | Discharge status: Home / Self Care | Payer: Medicaid Other | Source: Ambulatory Visit | Attending: Student | Admitting: Student

## 2018-06-10 VITALS — BP 104/57 | HR 89 | Wt 118.3 lb

## 2018-06-10 DIAGNOSIS — Z3480 Encounter for supervision of other normal pregnancy, unspecified trimester: Secondary | ICD-10-CM | POA: Insufficient documentation

## 2018-06-10 DIAGNOSIS — Z3483 Encounter for supervision of other normal pregnancy, third trimester: Secondary | ICD-10-CM

## 2018-06-10 DIAGNOSIS — Z789 Other specified health status: Secondary | ICD-10-CM

## 2018-06-10 DIAGNOSIS — O26843 Uterine size-date discrepancy, third trimester: Secondary | ICD-10-CM

## 2018-06-10 NOTE — Progress Notes (Signed)
   PRENATAL VISIT NOTE  Subjective:  Terri Miller is a 28 y.o. [id number] at [redacted]w[redacted]d being seen today for ongoing prenatal care.  She is currently monitored for the following issues for this low-risk pregnancy and has Supervision of other normal pregnancy, antepartum; Language barrier; and Uterine size date discrepancy on their problem list.  Patient reports no complaints.  Contractions: Irritability. Vag. Bleeding: None.  Movement: Present. Denies leaking of fluid.   The following portions of the patient's history were reviewed and updated as appropriate: allergies, current medications, past family history, past medical history, past social history, past surgical history and problem list. Problem list updated.  Objective:   Vitals:   06/10/18 0828  BP: (!) 104/57  Pulse: 89  Weight: 118 lb 4.8 oz (53.7 kg)    Fetal Status: Fetal Heart Rate (bpm): 150 Fundal Height: 33 cm Movement: Present  Presentation: Vertex  General:  Alert, oriented and cooperative. Patient is in no acute distress.  Skin: Skin is warm and dry. No rash noted.   Cardiovascular: Normal heart rate noted  Respiratory: Normal respiratory effort, no problems with respiration noted  Abdomen: Soft, gravid, appropriate for gestational age.  Pain/Pressure: Present     Pelvic: Cervical exam performed Dilation: Closed Effacement (%): 50 Station: -3  Extremities: Normal range of motion.     Mental Status: Normal mood and affect. Normal behavior. Normal judgment and thought content.   Assessment and Plan:  Pregnancy: [id number] at [redacted]w[redacted]d  1. Supervision of other normal pregnancy, antepartum  - Culture, beta strep (group b only) - GC/Chlamydia probe amp ([place])not at [facility]  2. Language barrier -video french interpreter used  3. Uterine size-date discrepancy in third trimester -FH 33 cm today, same as last visit. Has been lagging during pregnant. Will get growth ultrasound, scheduled for tomorrow morning.  - [place]  MFM OB FOLLOW UP; Future  Term labor symptoms and general obstetric precautions including but not limited to vaginal bleeding, contractions, leaking of fluid and fetal movement were reviewed in detail with the patient. Please refer to After Visit Summary for other counseling recommendations.  Return in about 1 week (around 06/17/2018) for Routine OB.  Future Appointments  Date Time Provider Department Center  06/11/2018  9:45 AM [facility] [place] 2 [facility]US  06/17/2018 10:15 AM Allie Bossier, MD [facility]  06/24/2018  8:15 AM Judeth Horn, NP [facility]  07/01/2018  8:15 AM Crisoforo Oxford, Charlesetta Garibaldi, CNM [facility]    Judeth Horn, NP

## 2018-06-10 NOTE — Patient Instructions (Signed)
Fetal Movement Counts Patient Name: ________________________________________________ Patient Due Date: ____________________ What is a fetal movement count?  A fetal movement count is the number of times that you feel your baby move during a certain amount of time. This may also be called a fetal kick count. A fetal movement count is recommended for every pregnant woman. You may be asked to start counting fetal movements as early as week 28 of your pregnancy. Pay attention to when your baby is most active. You may notice your baby's sleep and wake cycles. You may also notice things that make your baby move more. You should do a fetal movement count:  When your baby is normally most active.  At the same time each day. A good time to count movements is while you are resting, after having something to eat and drink. How do I count fetal movements? 1. Find a quiet, comfortable area. Sit, or lie down on your side. 2. Write down the date, the start time and stop time, and the number of movements that you felt between those two times. Take this information with you to your health care visits. 3. For 2 hours, count kicks, flutters, swishes, rolls, and jabs. You should feel at least 10 movements during 2 hours. 4. You may stop counting after you have felt 10 movements. 5. If you do not feel 10 movements in 2 hours, have something to eat and drink. Then, keep resting and counting for 1 hour. If you feel at least 4 movements during that hour, you may stop counting. Contact a health care provider if:  You feel fewer than 4 movements in 2 hours.  Your baby is not moving like he or she usually does. Date: ____________ Start time: ____________ Stop time: ____________ Movements: ____________ Date: ____________ Start time: ____________ Stop time: ____________ Movements: ____________ Date: ____________ Start time: ____________ Stop time: ____________ Movements: ____________ Date: ____________ Start time:  ____________ Stop time: ____________ Movements: ____________ Date: ____________ Start time: ____________ Stop time: ____________ Movements: ____________ Date: ____________ Start time: ____________ Stop time: ____________ Movements: ____________ Date: ____________ Start time: ____________ Stop time: ____________ Movements: ____________ Date: ____________ Start time: ____________ Stop time: ____________ Movements: ____________ Date: ____________ Start time: ____________ Stop time: ____________ Movements: ____________ This information is not intended to replace advice given to you by your health care provider. Make sure you discuss any questions you have with your health care provider. Document Released: 06/13/2006 Document Revised: 01/11/2016 Document Reviewed: 06/23/2015 Elsevier Interactive Patient Education  2019 [employer]. Before Baby Comes Home Once your baby is home with you, things may become a bit hectic as you map out a schedule around your newborn's patterns. Preparing the things you need at home before that time comes is important. Before your baby arrives, make sure you:  Have all the supplies that you will need to care for your baby.  Know where to go if there is an emergency.  Discuss the baby's arrival with other family members. What supplies will I need? Having the following supplies ready before your baby arrives will help ensure that you are prepared: Large items  Crib or bassinet and mattress. Make sure to follow safe sleep recommendations to reduce the risk of sudden infant death syndrome.  Rear-facing infant car seat. Have a trained professional check to make sure that it is installed in your car correctly. Many hospitals and fire departments perform this service free of charge.  Stroller. Always make sure any products-including cribs, mattresses, bassinets, or portable  cribs and play areas-are safe. Check for recalls on your specific brand and model of  crib. Breastfeeding  Nursing pillow.  Milk storage containers or bags.  Nipple cream.  Nursing bra.  Breast pads.  Breast pump.  Breast shields. Feeding  Formula.  Purified bottled water.  6-8 bottles (4-5 oz bottles and 8-9 oz bottles).  6-8 bottle nipples.  Bibs and burp cloths.  Bottle brush.  Bottle sterilizer (or a pot with a lid). Bathing  Infant bath basin.  Mild baby soap and baby shampoo.  Soft cloth towel and washcloth.  Hooded towel. Diapering  Diapers. You may need to use as many as 10-12 diapers each day.  Baby wipes.  Diaper cream.  Petroleum jelly.  Changing pad.  Hand sanitizer. Health and safety  Rectal thermometer.  Infant medicines.  Bulb syringe.  Baby nail clippers.  Baby monitor.  2-3 pacifiers, if desired. Sleeping  Sleep sack or swaddling blanket.  Firm mattress pad and fitted sheets for the crib or bassinet. Other supplies  Diaper bag.  Clothing, including one-piece outfits and pajamas.  Receiving blankets. Follow these instructions at home: Preparing for an emergency Prepare for an emergency by taking these steps:  Know when to seek care or call your health care provider.  Know how to get to the nearest hospital.  List the phone numbers of your baby's health care providers near your home phone and in your cell phone.  Take an infant first aid and CPR class.  Place the phone number for the poison control center on your refrigerator.  If there will be caregivers in the home, make sure your phone number, emergency contacts, and address are placed on the refrigerator in case they need to be given to emergency services. Preparing your family   Create a plan for visitors. Keep your baby away from people who have a cough, fever, or other symptoms of illness.  Prepare freezer meals ahead of time, and ask friends and family to help with meal preparation, errands, and everyday tasks.  If you have other  children: ? Talk with them about the baby coming home. Ask them how they feel about it. ? Read a book together about being a new big brother or sister. ? Find ways to let them help you prepare for the new baby. ? Have someone ready to care for them while you are in the hospital. Where to find more information  Consumer Product Safety Commission: http://johnston-ramirez.com/  American Academy of Pediatrics: www.healthychildren.org  Safe Kids Worldwide: www.safekids.org Summary  Planning is important before bringing your baby home from the hospital. You will need to have certain supplies ready before your baby arrives.  You will need to have a rear-facing infant car seat ready prior to bringing your baby home. Have a trained professional check to make sure that it is installed in your car correctly.  Always make sure any products-including cribs, mattresses, bassinets, or portable cribs and play areas-are safe. Check for recalls on your specific brand and model of crib.  Know when to seek care or call your health care provider, and know how to get to the nearest hospital. This information is not intended to replace advice given to you by your health care provider. Make sure you discuss any questions you have with your health care provider. Document Released: 04/26/2008 Document Revised: 04/03/2017 Document Reviewed: 04/03/2017 Elsevier Interactive Patient Education  2019 [employer].

## 2018-06-11 ENCOUNTER — Ambulatory Visit (HOSPITAL_COMMUNITY)
Admission: RE | Admit: 2018-06-11 | Discharge: 2018-06-11 | Disposition: A | Discharge status: Home / Self Care | Payer: Medicaid Other | Source: Ambulatory Visit | Attending: Student | Admitting: Student

## 2018-06-11 DIAGNOSIS — O26843 Uterine size-date discrepancy, third trimester: Secondary | ICD-10-CM | POA: Diagnosis not present

## 2018-06-11 DIAGNOSIS — Z3A36 36 weeks gestation of pregnancy: Secondary | ICD-10-CM | POA: Diagnosis not present

## 2018-06-11 LAB — GC/CHLAMYDIA PROBE AMP (~~LOC~~) NOT AT ARMC
Chlamydia: NEGATIVE
Neisseria Gonorrhea: NEGATIVE

## 2018-06-14 LAB — CULTURE, BETA STREP (GROUP B ONLY): STREP GP B CULTURE: NEGATIVE

## 2018-06-17 ENCOUNTER — Ambulatory Visit (INDEPENDENT_AMBULATORY_CARE_PROVIDER_SITE_OTHER): Payer: Medicaid Other | Admitting: Obstetrics & Gynecology

## 2018-06-17 VITALS — BP 109/64 | HR 85 | Wt 120.0 lb

## 2018-06-17 DIAGNOSIS — Z3483 Encounter for supervision of other normal pregnancy, third trimester: Secondary | ICD-10-CM

## 2018-06-17 DIAGNOSIS — Z3A37 37 weeks gestation of pregnancy: Secondary | ICD-10-CM

## 2018-06-17 DIAGNOSIS — Z789 Other specified health status: Secondary | ICD-10-CM

## 2018-06-17 DIAGNOSIS — Z3480 Encounter for supervision of other normal pregnancy, unspecified trimester: Secondary | ICD-10-CM

## 2018-06-17 NOTE — Progress Notes (Signed)
   PRENATAL VISIT NOTE  Subjective:  Terri Miller is a 28 y.o. [id number] at 7052w3d being seen today for ongoing prenatal care.  She is currently monitored for the following issues for this low-risk pregnancy and has Supervision of other normal pregnancy, antepartum; Language barrier; and Uterine size date discrepancy on their problem list.  Patient reports no complaints.  Contractions: Irritability. Vag. Bleeding: None.  Movement: Present. Denies leaking of fluid.   The following portions of the patient's history were reviewed and updated as appropriate: allergies, current medications, past family history, past medical history, past social history, past surgical history and problem list. Problem list updated.  Objective:   Vitals:   06/17/18 1038  BP: 109/64  Pulse: 85  Weight: 120 lb (54.4 kg)    Fetal Status:     Movement: Present     General:  Alert, oriented and cooperative. Patient is in no acute distress.  Skin: Skin is warm and dry. No rash noted.   Cardiovascular: Normal heart rate noted  Respiratory: Normal respiratory effort, no problems with respiration noted  Abdomen: Soft, gravid, appropriate for gestational age.  Pain/Pressure: Present     Pelvic: Cervical exam performed        Extremities: Normal range of motion.  Edema: None  Mental Status: Normal mood and affect. Normal behavior. Normal judgment and thought content.   Assessment and Plan:  Pregnancy: [id number] at 4952w3d  There are no diagnoses linked to this encounter. Preterm labor symptoms and general obstetric precautions including but not limited to vaginal bleeding, contractions, leaking of fluid and fetal movement were reviewed in detail with the patient. Please refer to After Visit Summary for other counseling recommendations.  No follow-ups on file.  Future Appointments  Date Time Provider Department Center  06/24/2018  8:15 AM Judeth HornLawrence, Erin, [facility]  07/01/2018  8:15 AM Crisoforo OxfordKooistra, Charlesetta GaribaldiKathryn  Lorraine, CNM [facility]    Allie BossierMyra C Lanaya Bennis, MD

## 2018-06-24 ENCOUNTER — Ambulatory Visit (INDEPENDENT_AMBULATORY_CARE_PROVIDER_SITE_OTHER): Payer: Medicaid Other | Admitting: Student

## 2018-06-24 VITALS — BP 101/48 | HR 76 | Wt 122.0 lb

## 2018-06-24 DIAGNOSIS — Z3483 Encounter for supervision of other normal pregnancy, third trimester: Secondary | ICD-10-CM

## 2018-06-24 DIAGNOSIS — Z789 Other specified health status: Secondary | ICD-10-CM

## 2018-06-24 DIAGNOSIS — Z3480 Encounter for supervision of other normal pregnancy, unspecified trimester: Secondary | ICD-10-CM

## 2018-06-24 NOTE — Progress Notes (Signed)
   PRENATAL VISIT NOTE  Subjective:  Terri Miller is a 28 y.o. [id number] at [redacted]w[redacted]d being seen today for ongoing prenatal care.  She is currently monitored for the following issues for this low-risk pregnancy and has Supervision of other normal pregnancy, antepartum; Language barrier; and Uterine size date discrepancy on their problem list.  Patient reports no complaints.  Contractions: Irregular. Vag. Bleeding: None.  Movement: Present. Denies leaking of fluid.   The following portions of the patient's history were reviewed and updated as appropriate: allergies, current medications, past family history, past medical history, past social history, past surgical history and problem list. Problem list updated.  Objective:   Vitals:   06/24/18 0836  BP: (!) 101/48  Pulse: 76  Weight: 122 lb (55.3 kg)    Fetal Status: Fetal Heart Rate (bpm): 130 Fundal Height: 34 cm Movement: Present  Presentation: Vertex  General:  Alert, oriented and cooperative. Patient is in no acute distress.  Skin: Skin is warm and dry. No rash noted.   Cardiovascular: Normal heart rate noted  Respiratory: Normal respiratory effort, no problems with respiration noted  Abdomen: Soft, gravid, appropriate for gestational age.  Pain/Pressure: Present     Pelvic: Cervical exam performed Dilation: 1.5 Effacement (%): 50 Station: -3  Extremities: Normal range of motion.  Edema: None  Mental Status: Normal mood and affect. Normal behavior. Normal judgment and thought content.   Assessment and Plan:  Pregnancy: [id number] at [redacted]w[redacted]d  1. Supervision of other normal pregnancy, antepartum -doing well, no complaints -FH still low but had normal growth ultrasound earlier this month  2. Language barrier -[name] interpreter at bedside  Term labor symptoms and general obstetric precautions including but not limited to vaginal bleeding, contractions, leaking of fluid and fetal movement were reviewed in detail with the  patient. Please refer to After Visit Summary for other counseling recommendations.  Return in about 1 week (around 07/01/2018) for Routine OB.  Future Appointments  Date Time Provider Department Center  07/01/2018  8:15 AM Marylene Land, CNM [facility]    Judeth Horn, NP

## 2018-06-24 NOTE — Patient Instructions (Signed)

## 2018-07-01 ENCOUNTER — Ambulatory Visit (INDEPENDENT_AMBULATORY_CARE_PROVIDER_SITE_OTHER): Payer: Medicaid Other | Admitting: Student

## 2018-07-01 ENCOUNTER — Other Ambulatory Visit: Payer: Self-pay | Admitting: Student

## 2018-07-01 VITALS — BP 102/64 | HR 76 | Wt 123.0 lb

## 2018-07-01 DIAGNOSIS — Z3483 Encounter for supervision of other normal pregnancy, third trimester: Secondary | ICD-10-CM

## 2018-07-01 DIAGNOSIS — O26843 Uterine size-date discrepancy, third trimester: Secondary | ICD-10-CM

## 2018-07-01 DIAGNOSIS — Z3480 Encounter for supervision of other normal pregnancy, unspecified trimester: Secondary | ICD-10-CM

## 2018-07-01 NOTE — Patient Instructions (Signed)
Labor Induction    Labor induction is when steps are taken to cause a pregnant woman to begin the labor process. Most women go into labor on their own between 37 weeks and 42 weeks of pregnancy. When this does not happen or when there is a medical need for labor to begin, steps may be taken to induce labor. Labor induction causes a pregnant woman's uterus to contract. It also causes the cervix to soften (ripen), open (dilate), and thin out (efface). Usually, labor is not induced before 39 weeks of pregnancy unless there is a medical reason to do so. Your health care provider will determine if labor induction is needed.  Before inducing labor, your health care provider will consider a number of factors, including:  · Your medical condition and your baby's.  · How many weeks along you are in your pregnancy.  · How mature your baby's lungs are.  · The condition of your cervix.  · The position of your baby.  · The size of your birth canal.  What are some reasons for labor induction?  Labor may be induced if:  · Your health or your baby's health is at risk.  · Your pregnancy is overdue by 1 week or more.  · Your water breaks but labor does not start on its own.  · There is a low amount of amniotic fluid around your baby.  You may also choose (elect) to have labor induced at a certain time. Generally, elective labor induction is done no earlier than 39 weeks of pregnancy.  What methods are used for labor induction?  Methods used for labor induction include:  · Prostaglandin medicine. This medicine starts contractions and causes the cervix to dilate and ripen. It can be taken by mouth (orally) or by being inserted into the vagina (suppository).  · Inserting a small, thin tube (catheter) with a balloon into the vagina and then expanding the balloon with water to dilate the cervix.  · Stripping the membranes. In this method, your health care provider gently separates amniotic sac tissue from the cervix. This causes the  cervix to stretch, which in turn causes the release of a hormone called progesterone. The hormone causes the uterus to contract. This procedure is often done during an office visit, after which you will be sent home to wait for contractions to begin.  · Breaking the water. In this method, your health care provider uses a small instrument to make a small hole in the amniotic sac. This eventually causes the amniotic sac to break. Contractions should begin after a few hours.  · Medicine to trigger or strengthen contractions. This medicine is given through an IV that is inserted into a vein in your arm.  Except for membrane stripping, which can be done in a clinic, labor induction is done in the hospital so that you and your baby can be carefully monitored.  How long does it take for labor to be induced?  The length of time it takes to induce labor depends on how ready your body is for labor. Some inductions can take up to 2-3 days, while others may take less than a day. Induction may take longer if:  · You are induced early in your pregnancy.  · It is your first pregnancy.  · Your cervix is not ready.  What are some risks associated with labor induction?  Some risks associated with labor induction include:  · Changes in fetal heart rate, such as being too   high, too low, or irregular (erratic).  · Failed induction.  · Infection in the mother or the baby.  · Increased risk of having a cesarean delivery.  · Fetal death.  · Breaking off (abruption) of the placenta from the uterus (rare).  · Rupture of the uterus (very rare).  When induction is needed for medical reasons, the benefits of induction generally outweigh the risks.  What are some reasons for not inducing labor?  Labor induction should not be done if:  · Your baby does not tolerate contractions.  · You have had previous surgeries on your uterus, such as a myomectomy, removal of fibroids, or a vertical scar from a previous cesarean delivery.  · Your placenta lies  very low in your uterus and blocks the opening of the cervix (placenta previa).  · Your baby is not in a head-down position.  · The umbilical cord drops down into the birth canal in front of the baby.  · There are unusual circumstances, such as the baby being very early (premature).  · You have had more than 2 previous cesarean deliveries.  Summary  · Labor induction is when steps are taken to cause a pregnant woman to begin the labor process.  · Labor induction causes a pregnant woman's uterus to contract. It also causes the cervix to ripen, dilate, and efface.  · Labor is not induced before 39 weeks of pregnancy unless there is a medical reason to do so.  · When induction is needed for medical reasons, the benefits of induction generally outweigh the risks.  This information is not intended to replace advice given to you by your health care provider. Make sure you discuss any questions you have with your health care provider.  Document Released: 10/03/2006 Document Revised: 06/27/2016 Document Reviewed: 06/27/2016  Elsevier Interactive Patient Education © 2019 Elsevier Inc.

## 2018-07-01 NOTE — Progress Notes (Signed)
   PRENATAL VISIT NOTE  Subjective:  Terri Miller is a 28 y.o. [id number] at [redacted]w[redacted]d being seen today for ongoing prenatal care.  She is currently monitored for the following issues for this low-risk pregnancy and has Supervision of other normal pregnancy, antepartum; Language barrier; and Uterine size date discrepancy on their problem list.  Patient reports no complaints.  Contractions: Irregular. Vag. Bleeding: None.  Movement: Present. Denies leaking of fluid.   The following portions of the patient's history were reviewed and updated as appropriate: allergies, current medications, past family history, past medical history, past social history, past surgical history and problem list. Problem list updated.  Objective:   Vitals:   07/01/18 0837  BP: 102/64  Pulse: 76  Weight: 123 lb (55.8 kg)    Fetal Status: Fetal Heart Rate (bpm): 146 Fundal Height: 36 cm Movement: Present  Presentation: Vertex  General:  Alert, oriented and cooperative. Patient is in no acute distress.  Skin: Skin is warm and dry. No rash noted.   Cardiovascular: Normal heart rate noted  Respiratory: Normal respiratory effort, no problems with respiration noted  Abdomen: Soft, gravid, appropriate for gestational age.  Pain/Pressure: Present     Pelvic: Cervical exam performed Dilation: 1 Effacement (%): 50 Station: -3  Extremities: Normal range of motion.  Edema: None  Mental Status: Normal mood and affect. Normal behavior. Normal judgment and thought content.   Assessment and Plan:  Pregnancy: [id number] at [redacted]w[redacted]d  1. Uterine size-date discrepancy in third trimester   2. Supervision of other normal pregnancy, antepartum    -FH measures 36 today; recent growth [place] on 1-15 showed normal growth -Induction scheduled for 2-15; orders placed.  -Will return next week for NST/BPP  Term labor symptoms and general obstetric precautions including but not limited to vaginal bleeding, contractions, leaking of fluid  and fetal movement were reviewed in detail with the patient. Please refer to After Visit Summary for other counseling recommendations.  Return in about 6 days (around 07/07/2018), or NST/BPP and OB appt on Mon, Tues or Wed.  Future Appointments  Date Time Provider Department Center  07/12/2018  7:00 AM [facility] None    Charlesetta Garibaldi [place], [place]

## 2018-07-04 ENCOUNTER — Telehealth (HOSPITAL_COMMUNITY): Payer: Self-pay | Admitting: *Deleted

## 2018-07-04 NOTE — Telephone Encounter (Signed)
Interpreter number [phone]

## 2018-07-07 ENCOUNTER — Other Ambulatory Visit: Payer: Medicaid Other

## 2018-07-07 ENCOUNTER — Inpatient Hospital Stay (HOSPITAL_COMMUNITY)
Admission: AD | Admit: 2018-07-07 | Discharge: 2018-07-08 | DRG: 807 | Disposition: A | Discharge status: Home / Self Care | Payer: Medicaid Other | Attending: Family Medicine | Admitting: Family Medicine

## 2018-07-07 ENCOUNTER — Encounter: Payer: Medicaid Other | Admitting: Nurse Practitioner

## 2018-07-07 ENCOUNTER — Encounter (HOSPITAL_COMMUNITY): Payer: Self-pay | Admitting: *Deleted

## 2018-07-07 ENCOUNTER — Other Ambulatory Visit: Payer: Self-pay

## 2018-07-07 DIAGNOSIS — Z603 Acculturation difficulty: Secondary | ICD-10-CM | POA: Diagnosis present

## 2018-07-07 DIAGNOSIS — O480 Post-term pregnancy: Secondary | ICD-10-CM | POA: Diagnosis not present

## 2018-07-07 DIAGNOSIS — O26843 Uterine size-date discrepancy, third trimester: Secondary | ICD-10-CM | POA: Diagnosis present

## 2018-07-07 DIAGNOSIS — Z3483 Encounter for supervision of other normal pregnancy, third trimester: Secondary | ICD-10-CM | POA: Diagnosis present

## 2018-07-07 DIAGNOSIS — Z3480 Encounter for supervision of other normal pregnancy, unspecified trimester: Secondary | ICD-10-CM

## 2018-07-07 DIAGNOSIS — O26849 Uterine size-date discrepancy, unspecified trimester: Secondary | ICD-10-CM | POA: Diagnosis present

## 2018-07-07 DIAGNOSIS — Z789 Other specified health status: Secondary | ICD-10-CM | POA: Diagnosis present

## 2018-07-07 DIAGNOSIS — Z3A40 40 weeks gestation of pregnancy: Secondary | ICD-10-CM

## 2018-07-07 LAB — TYPE AND SCREEN
ABO/RH(D): O POS
Antibody Screen: NEGATIVE

## 2018-07-07 LAB — CBC
HCT: 37.1 % (ref 36.0–46.0)
Hemoglobin: 12.1 g/dL (ref 12.0–15.0)
MCH: 28.5 pg (ref 26.0–34.0)
MCHC: 32.6 g/dL (ref 30.0–36.0)
MCV: 87.5 fL (ref 80.0–100.0)
Platelets: 194 10*3/uL (ref 150–400)
RBC: 4.24 MIL/uL (ref 3.87–5.11)
RDW: 14.1 % (ref 11.5–15.5)
WBC: 7.9 10*3/uL (ref 4.0–10.5)
nRBC: 0 % (ref 0.0–0.2)

## 2018-07-07 LAB — RPR: RPR Ser Ql: NONREACTIVE

## 2018-07-07 MED ORDER — DIPHENHYDRAMINE HCL 25 MG PO CAPS: 25.0000 mg | ORAL_CAPSULE | Freq: Four times a day (QID) | ORAL | Status: DC | PRN

## 2018-07-07 MED ORDER — SENNOSIDES-DOCUSATE SODIUM 8.6-50 MG PO TABS
2.0000 | ORAL_TABLET | ORAL | Status: DC
  Administered 2018-07-07: 2 via ORAL
  Filled 2018-07-07: qty 2

## 2018-07-07 MED ORDER — LACTATED RINGERS IV SOLN: 500.0000 mL | INTRAVENOUS | Status: DC | PRN

## 2018-07-07 MED ORDER — PRENATAL MULTIVITAMIN CH
1.0000 | ORAL_TABLET | Freq: Every day | ORAL | Status: DC
  Administered 2018-07-07 – 2018-07-08 (×2): 1 via ORAL
  Filled 2018-07-07 (×2): qty 1

## 2018-07-07 MED ORDER — LIDOCAINE HCL (PF) 1 % IJ SOLN
30.0000 mL | INTRAMUSCULAR | Status: DC | PRN
  Administered 2018-07-07: 30 mL via SUBCUTANEOUS
  Filled 2018-07-07 (×2): qty 30

## 2018-07-07 MED ORDER — OXYTOCIN BOLUS FROM INFUSION
500.0000 mL | Freq: Once | INTRAVENOUS | Status: AC
  Administered 2018-07-07: 500 mL via INTRAVENOUS

## 2018-07-07 MED ORDER — TETANUS-DIPHTH-ACELL PERTUSSIS 5-2.5-18.5 LF-MCG/0.5 IM SUSP: 0.5000 mL | Freq: Once | INTRAMUSCULAR | Status: DC

## 2018-07-07 MED ORDER — OXYCODONE-ACETAMINOPHEN 5-325 MG PO TABS: 1.0000 | ORAL_TABLET | ORAL | Status: DC | PRN

## 2018-07-07 MED ORDER — ONDANSETRON HCL 4 MG PO TABS: 4.0000 mg | ORAL_TABLET | ORAL | Status: DC | PRN

## 2018-07-07 MED ORDER — MEASLES, MUMPS & RUBELLA VAC IJ SOLR
0.5000 mL | Freq: Once | INTRAMUSCULAR | Status: DC
  Filled 2018-07-07: qty 0.5

## 2018-07-07 MED ORDER — ONDANSETRON HCL 4 MG/2ML IJ SOLN: 4.0000 mg | INTRAMUSCULAR | Status: DC | PRN

## 2018-07-07 MED ORDER — LACTATED RINGERS IV SOLN
INTRAVENOUS | Status: DC
  Administered 2018-07-07 (×3): via INTRAVENOUS

## 2018-07-07 MED ORDER — FENTANYL CITRATE (PF) 100 MCG/2ML IJ SOLN
100.0000 ug | INTRAMUSCULAR | Status: DC | PRN
  Administered 2018-07-07 (×3): 100 ug via INTRAVENOUS
  Filled 2018-07-07 (×3): qty 2

## 2018-07-07 MED ORDER — FENTANYL 2.5 MCG/ML BUPIVACAINE 1/10 % EPIDURAL INFUSION (WH - ANES)
14.0000 mL/h | INTRAMUSCULAR | Status: DC | PRN
  Filled 2018-07-07: qty 100

## 2018-07-07 MED ORDER — ACETAMINOPHEN 325 MG PO TABS
650.0000 mg | ORAL_TABLET | ORAL | Status: DC | PRN
  Administered 2018-07-07 – 2018-07-08 (×2): 650 mg via ORAL
  Filled 2018-07-07 (×2): qty 2

## 2018-07-07 MED ORDER — TERBUTALINE SULFATE 1 MG/ML IJ SOLN
0.2500 mg | Freq: Once | INTRAMUSCULAR | Status: DC | PRN
  Filled 2018-07-07: qty 1

## 2018-07-07 MED ORDER — PHENYLEPHRINE 40 MCG/ML (10ML) SYRINGE FOR IV PUSH (FOR BLOOD PRESSURE SUPPORT)
80.0000 ug | PREFILLED_SYRINGE | INTRAVENOUS | Status: DC | PRN
  Filled 2018-07-07 (×2): qty 10

## 2018-07-07 MED ORDER — DIPHENHYDRAMINE HCL 50 MG/ML IJ SOLN: 12.5000 mg | INTRAMUSCULAR | Status: DC | PRN

## 2018-07-07 MED ORDER — IBUPROFEN 600 MG PO TABS
600.0000 mg | ORAL_TABLET | Freq: Four times a day (QID) | ORAL | Status: DC
  Administered 2018-07-07 – 2018-07-08 (×5): 600 mg via ORAL
  Filled 2018-07-07 (×5): qty 1

## 2018-07-07 MED ORDER — OXYTOCIN 40 UNITS IN NORMAL SALINE INFUSION - SIMPLE MED
2.5000 [IU]/h | INTRAVENOUS | Status: DC
  Administered 2018-07-07: 2.5 [IU]/h via INTRAVENOUS
  Filled 2018-07-07: qty 1000

## 2018-07-07 MED ORDER — EPHEDRINE 5 MG/ML INJ
10.0000 mg | INTRAVENOUS | Status: DC | PRN
  Filled 2018-07-07: qty 2

## 2018-07-07 MED ORDER — LACTATED RINGERS IV SOLN
500.0000 mL | Freq: Once | INTRAVENOUS | Status: AC
  Administered 2018-07-07: 1000 mL via INTRAVENOUS

## 2018-07-07 MED ORDER — SOD CITRATE-CITRIC ACID 500-334 MG/5ML PO SOLN: 30.0000 mL | ORAL | Status: DC | PRN

## 2018-07-07 MED ORDER — MISOPROSTOL 25 MCG QUARTER TABLET
25.0000 ug | ORAL_TABLET | ORAL | Status: DC | PRN
  Filled 2018-07-07: qty 1

## 2018-07-07 MED ORDER — ACETAMINOPHEN 325 MG PO TABS: 650.0000 mg | ORAL_TABLET | ORAL | Status: DC | PRN

## 2018-07-07 MED ORDER — COCONUT OIL OIL: 1.0000 "application " | TOPICAL_OIL | Status: DC | PRN

## 2018-07-07 MED ORDER — DIBUCAINE 1 % RE OINT: 1.0000 "application " | TOPICAL_OINTMENT | RECTAL | Status: DC | PRN

## 2018-07-07 MED ORDER — ONDANSETRON HCL 4 MG/2ML IJ SOLN: 4.0000 mg | Freq: Four times a day (QID) | INTRAMUSCULAR | Status: DC | PRN

## 2018-07-07 MED ORDER — OXYCODONE-ACETAMINOPHEN 5-325 MG PO TABS: 2.0000 | ORAL_TABLET | ORAL | Status: DC | PRN

## 2018-07-07 MED ORDER — WITCH HAZEL-GLYCERIN EX PADS: 1.0000 "application " | MEDICATED_PAD | CUTANEOUS | Status: DC | PRN

## 2018-07-07 MED ORDER — SIMETHICONE 80 MG PO CHEW: 80.0000 mg | CHEWABLE_TABLET | ORAL | Status: DC | PRN

## 2018-07-07 MED ORDER — BENZOCAINE-MENTHOL 20-0.5 % EX AERO: 1.0000 "application " | INHALATION_SPRAY | CUTANEOUS | Status: DC | PRN

## 2018-07-07 MED ORDER — PHENYLEPHRINE 40 MCG/ML (10ML) SYRINGE FOR IV PUSH (FOR BLOOD PRESSURE SUPPORT)
80.0000 ug | PREFILLED_SYRINGE | INTRAVENOUS | Status: DC | PRN
  Filled 2018-07-07: qty 10

## 2018-07-07 NOTE — MAU Note (Signed)
Pt here with c/o contractions.

## 2018-07-07 NOTE — Lactation Note (Signed)
This note was copied from a baby's chart. Lactation Consultation Note  Patient Name: Terri Miller [id number] Date: 07/07/2018 Reason for consult: Initial assessment;Term P3, 16 hour female infant.  Mom feed plan on admission is Breastfeeding and supplementing with formula. Per mom, active on [facility] program in [place]. Per mom, she BF her other two daughters for one year each. LC changed a stool while in room large and  (greenish) in color. Infant had 3 stools and mom unsure how many wet diapers due nurse changing them. Mom doesn't have breast pump at home, [facility] gave mom a harmony hand pump and discussed assembly, re-assembly, cleaning and storage. Mom latched infant on left breast using cross cradle hold, infant sustained latch 12 minutes, swallowing observed by LC. Mom easily hand expressed 5 ml of colostrum that was spoon feed to infant. LC notice mom has inverted nipples (dimpling) both breast, infant latches well, breast shells and NS is not needed at this time.   Mom knows to breastfeed according hunger cues and mom will attempt breastfeed 8 to 12 times within 24 hours. LC discussed I & O. Reviewed Baby & Me book's Breastfeeding Basics.  Mom will call Nurse or LC services if she has any questions, concerns or need assistance with latching infant to breast. Mom made aware of O/P services, breastfeeding support groups, community resources, and our phone # for post-discharge questions.   Maternal Data Formula Feeding for Exclusion: Yes Reason for exclusion: Mother's choice to formula and breast feed on admission Has patient been taught Hand Expression?: Yes(Mom hand expressed 5 ml of colostrum that was spoon feed ) Does the patient have breastfeeding experience prior to this delivery?: Yes(Per mom, she BF other two children for one year.)  Feeding Feeding Type: Breast Fed Nipple Type: Slow - flow  LATCH Score Latch: Grasps breast easily, tongue down, lips flanged,  rhythmical sucking.  Audible Swallowing: Spontaneous and intermittent  Type of Nipple: Inverted  Comfort (Breast/Nipple): Soft / non-tender  Hold (Positioning): Assistance needed to correctly position infant at breast and maintain latch.  LATCH Score: 7  Interventions Interventions: Breast feeding basics reviewed;Assisted with latch;Skin to skin;Hand express;Adjust position;Support pillows;Position options;Expressed milk;Hand pump  Lactation Tools Discussed/Used Tools: Pump Breast pump type: Manual WIC Program: Yes Pump Review: Setup, frequency, and cleaning;Milk Storage Initiated by:: Danelle Earthly, IBCLC Date initiated:: 07/07/18   Consult Status Consult Status: Follow-up Date: 07/08/18 Follow-up type: In-patient    Danelle Earthly 07/07/2018, 10:51 PM

## 2018-07-07 NOTE — Progress Notes (Signed)
[employer] iPad used to contact [place] translator [phone]. Pt agreed to allow husband to translate for her. Risks of miscommunication gone over with pt via interpretor, pt verbalized understanding. Consents signed and placed in chart at this time.

## 2018-07-07 NOTE — H&P (Signed)
LABOR AND DELIVERY ADMISSION HISTORY AND PHYSICAL NOTE  Terri Miller is a 28 y.o. female [id number] with IUP at [redacted]w[redacted]d by LMP=17w [place] presenting for contractions.  She reports positive fetal movement. She denies leakage of fluid or vaginal bleeding.  Prenatal History/Complications: PNC at [facility] Pregnancy complications:  - none  Past Medical History: Past Medical History:  Diagnosis Date  . Medical history non-contributory     Past Surgical History: Past Surgical History:  Procedure Laterality Date  . NO PAST SURGERIES      Obstetrical History: OB History    Gravida  5   Para  2   Term  2   Preterm  0   AB  2   Living  2     SAB  2   TAB  0   Ectopic  0   Multiple  0   Live Births  1           Social History: Social History   Socioeconomic History  . Marital status: Married    Spouse name: Not on file  . Number of children: Not on file  . Years of education: Not on file  . Highest education level: Not on file  Occupational History  . Not on file  Social Needs  . Financial resource strain: Not on file  . Food insecurity:    Worry: Never true    Inability: Never true  . Transportation needs:    Medical: No    Non-medical: No  Tobacco Use  . Smoking status: Never Smoker  . Smokeless tobacco: Never Used  Substance and Sexual Activity  . Alcohol use: No  . Drug use: No  . Sexual activity: Yes    Birth control/protection: None  Lifestyle  . Physical activity:    Days per week: Not on file    Minutes per session: Not on file  . Stress: Not on file  Relationships  . Social connections:    Talks on phone: Not on file    Gets together: Not on file    Attends religious service: Not on file    Active member of club or organization: Not on file    Attends meetings of clubs or organizations: Not on file    Relationship status: Not on file  Other Topics Concern  . Not on file  Social History Narrative  . Not on file    Family  History: No family history on file.  Allergies: No Known Allergies  Medications Prior to Admission  Medication Sig Dispense Refill Last Dose  . ferrous fumarate (HEMOCYTE - 106 MG FE) 325 (106 Fe) MG TABS tablet Take 1 tablet (106 mg of iron total) by mouth daily. 30 each 3 07/06/2018 at Unknown time  . Prenatal Multivit-Min-Fe-FA (PRENATAL VITAMINS) 0.8 MG tablet Take 1 tablet by mouth daily. 30 tablet 12 07/06/2018 at Unknown time     Review of Systems  All systems reviewed and negative except as stated in HPI  Physical Exam Weight 54.9 kg, last menstrual period 09/28/2017, unknown if currently breastfeeding. General appearance: alert, oriented, NAD Lungs: normal respiratory effort Heart: regular rate Abdomen: soft, non-tender; gravid, FH appropriate for GA Extremities: No calf swelling or tenderness Presentation: cephalic Fetal monitoring: 125/mod var/+ acels/no decels Uterine activity: regular q51m Dilation: 4.5 Effacement (%): 90 Station: -2 Exam by:: Elie Confer RN  Prenatal labs: ABO, Rh: O/Positive/-- (08/15 1431) Antibody: Negative (08/15 1431) Rubella: 17.80 (08/15 1431) RPR: Non Reactive (11/21 0909)  HBsAg:  Negative (08/15 1431)  HIV: Non Reactive (11/21 0909)  GC/Chlamydia: negative GBS:   negative 2-hr GTT: 90/60/61 Genetic screening:  declined Anatomy US: Normal  Prenatal Transfer Tool  Maternal Diabetes: No Genetic Screening: Declined Maternal Ultrasounds/Referrals: Normal Fetal Ultrasounds or other Referrals:  None Maternal Substance Abuse:  No Significant Maternal Medications:  None Significant Maternal Lab Results: None  No results found for this or any previous visit (from the past 24 hour(s)).  Patient Active Problem List   Diagnosis Date Noted  . Uterine size date discrepancy 04/29/2018  . Supervision of other normal pregnancy, antepartum 01/09/2018  . Language barrier 01/09/2018    Assessment: Terri Miller is a 28 y.o. [id number] at  [redacted]w[redacted]d here for labor  #Labor: Regular contractions with cervical change since last check. Expectant management. Will start pitocin if needed #Pain: Desires unmedicated delivery  #FWB: Cat 1 #ID:  GBS neg #MOF: Both #MOC:Nexplanon #Circ:  NA  Terri Abbot, MD  07/07/2018, 2:49 AM

## 2018-07-07 NOTE — Progress Notes (Signed)
Pt had requested epidural. At time of setup pt stated, "No through the IV." Status iPad used to contact [name] interpreter [phone]. Clarified options for pain control. Pt requests to proceed with IV pain medication and not epidural.

## 2018-07-07 NOTE — Discharge Summary (Signed)
Postpartum Discharge Summary   Patient Name: Terri Miller DOB: 27-Mar-1991 MRN: [id number]  Date of admission: 07/07/2018 Delivering Provider: [name]   Date of discharge: 07/08/2018  Admitting diagnosis: CTX Intrauterine pregnancy: [redacted]w[redacted]d     Secondary diagnosis:  Active Problems:   Language barrier   Uterine size date discrepancy   Indication for care in labor and delivery, antepartum    Discharge diagnosis: Term Pregnancy Delivered                                  Post partum procedures:none Augmentation: AROM Complications: None  Outpatient Follow-Up [ ]  postpartum Pap smear [ ]  Nexplanon placement   Hospital course:  Onset of Labor With Vaginal Delivery     28 y.o. yo [id number] at 107w2d was admitted in Active Labor on 07/07/2018. Patient had an uncomplicated labor course as follows:  Membrane Rupture Time/Date: 3:42 AM ,07/07/2018   Intrapartum Procedures: Episiotomy: None [1]                                         Lacerations:  2nd degree [3];Perineal [11]  Patient had a delivery of a Viable infant. 07/07/2018  Information for the patient's newborn:  Henna Dauria, Girl Meilyn [[id number]]  Delivery Method: Vag-Spont(filed from delivery)   Patient had an uncomplicated postpartum course. Pain is well-controlled.  She is ambulating, tolerating a regular diet, passing flatus, and urinating well. Patient is discharged home in stable condition on 07/08/18.  Magnesium Sulfate recieved: No BMZ received: No  Physical exam  Vitals:   07/07/18 1300 07/07/18 1740 07/07/18 2136 07/08/18 0930  BP: 100/60 101/60 108/63 100/63  Pulse: 60 72 82 76  Resp: 17 18 16 20   Temp: 98.2 F (36.8 C) 98.2 F (36.8 C) 98.3 F (36.8 C) 98.7 F (37.1 C)  TempSrc: Oral  Oral Oral  SpO2: 99%  98%   Weight:      Height:       General: alert, cooperative and no distress Lochia: appropriate Uterine Fundus: firm, below umbilicus  Incision: N/A DVT Evaluation: No lower extremity  edema  Labs: Lab Results  Component Value Date   WBC 7.9 07/07/2018   HGB 12.1 07/07/2018   HCT 37.1 07/07/2018   MCV 87.5 07/07/2018   PLT 194 07/07/2018   CMP Latest Ref Rng & Units 02/15/2018  Glucose 70 - 99 mg/dL 70(B)  BUN 6 - 20 mg/dL 6  Creatinine 8.67 - 5.44 mg/dL 9.20  Sodium 100 - 712 mmol/L 137  Potassium 3.5 - 5.1 mmol/L 3.7  Chloride 98 - 111 mmol/L 107  CO2 22 - 32 mmol/L 20(L)  Calcium 8.9 - 10.3 mg/dL 1.9(X)  Total Protein 6.5 - 8.1 g/dL 6.6  Total Bilirubin 0.3 - 1.2 mg/dL 1.0  Alkaline Phos 38 - 126 U/L 55  AST 15 - 41 U/L 15  ALT 0 - 44 U/L 11    Discharge instruction: per After Visit Summary and "Baby and Me Booklet".  After visit meds:  Allergies as of 07/08/2018   No Known Allergies     Medication List    TAKE these medications   ferrous fumarate 325 (106 Fe) MG Tabs tablet Commonly known as:  HEMOCYTE - 106 mg FE Take 1 tablet (106 mg of iron total) by mouth daily.   ibuprofen 800  MG tablet Commonly known as:  ADVIL,MOTRIN Take 1 tablet (800 mg total) by mouth 3 (three) times daily.   Prenatal Vitamins 0.8 MG tablet Take 1 tablet by mouth daily.   senna-docusate 8.6-50 MG tablet Commonly known as:  Senokot-S Take 2 tablets by mouth at bedtime as needed for mild constipation.       Diet: routine diet Activity: Advance as tolerated. Pelvic rest for 6 weeks.   Outpatient follow up:6 weeks Follow up Appt: Future Appointments  Date Time Provider Department Center  08/12/2018  1:55 PM Tamera StandsWallace, Marly Schuld S, DO [facility]   Follow up Visit: Please schedule this patient for Postpartum visit in: 6 weeks with the following provider: Any provider For C/S patients schedule nurse incision check in weeks 2 weeks: no Low risk pregnancy complicated by: None Delivery mode:  SVD Anticipated Birth Control:  Nexplanon PP Procedures needed: None  Schedule Integrated BH visit: no  Newborn Data: Live born female  Birth Weight: 7 lb 12.7 oz (3535  g) APGAR: 9, 9  Newborn Delivery   Birth date/time:  07/07/2018 06:44:00 Delivery type:  Vaginal, Spontaneous    Baby Feeding: Breast Disposition:home with mother  07/08/2018 Tamera StandsLaurel S Larisa Lanius, DO

## 2018-07-08 MED ORDER — SENNOSIDES-DOCUSATE SODIUM 8.6-50 MG PO TABS: 2.0000 | ORAL_TABLET | Freq: Every evening | ORAL | 0 refills | Status: DC | PRN

## 2018-07-08 MED ORDER — IBUPROFEN 800 MG PO TABS: 800.0000 mg | ORAL_TABLET | Freq: Three times a day (TID) | ORAL | 0 refills | Status: DC

## 2018-07-08 NOTE — Lactation Note (Signed)
This note was copied from a baby's chart. Lactation Consultation Note  Patient Name: Terri Miller GUYQI'H Date: 07/08/2018 Reason for consult: Follow-up assessment Mom is breastfeeding and supplementing with formula.  She states she wont use formula after milk comes in.  No concerns or questions.  Lactation outpatient services and support reviewed and encouraged prn.  Maternal Data    Feeding Feeding Type: Bottle Fed - Formula  LATCH Score                   Interventions    Lactation Tools Discussed/Used     Consult Status Consult Status: Complete Follow-up type: Call as needed    Huston Foley 07/08/2018, 9:46 AM

## 2018-07-12 ENCOUNTER — Inpatient Hospital Stay (HOSPITAL_COMMUNITY): Admission: RE | Admit: 2018-07-12 | Payer: Medicaid Other | Source: Ambulatory Visit

## 2018-08-11 ENCOUNTER — Telehealth: Payer: Self-pay | Admitting: Family Medicine

## 2018-08-11 NOTE — Telephone Encounter (Signed)
Called patient with french interpreter to inform about the restrictions at the office due to the coronavirus. Husband answered the phone and the information was given to him he spoke english. Husband verbalized understanding.

## 2018-08-12 ENCOUNTER — Ambulatory Visit (INDEPENDENT_AMBULATORY_CARE_PROVIDER_SITE_OTHER): Payer: Medicaid Other | Admitting: Family Medicine

## 2018-08-12 ENCOUNTER — Other Ambulatory Visit (HOSPITAL_COMMUNITY)
Admission: RE | Admit: 2018-08-12 | Discharge: 2018-08-12 | Disposition: A | Discharge status: Home / Self Care | Payer: Medicaid Other | Source: Ambulatory Visit | Attending: Family Medicine | Admitting: Family Medicine

## 2018-08-12 ENCOUNTER — Encounter: Payer: Self-pay | Admitting: Family Medicine

## 2018-08-12 ENCOUNTER — Other Ambulatory Visit: Payer: Self-pay

## 2018-08-12 DIAGNOSIS — Z1389 Encounter for screening for other disorder: Secondary | ICD-10-CM

## 2018-08-12 DIAGNOSIS — Z30017 Encounter for initial prescription of implantable subdermal contraceptive: Secondary | ICD-10-CM | POA: Diagnosis not present

## 2018-08-12 LAB — POCT PREGNANCY, URINE: Preg Test, Ur: NEGATIVE

## 2018-08-12 MED ORDER — ETONOGESTREL 68 MG ~~LOC~~ IMPL
68.0000 mg | DRUG_IMPLANT | Freq: Once | SUBCUTANEOUS | Status: AC
  Administered 2018-08-12: 68 mg via SUBCUTANEOUS

## 2018-08-12 NOTE — Progress Notes (Signed)
Subjective:     Terri Miller is a 28 y.o. female who presents for a postpartum visit. She is 5 weeks postpartum following a spontaneous vaginal delivery. I have fully reviewed the prenatal and intrapartum course. The delivery was at [redacted]w[redacted]d gestational weeks. Outcome: spontaneous vaginal delivery. Anesthesia: IV sedation. Postpartum course has been uncomplicated. Baby's course has been uncomplicated. Baby is feeding by both breast and bottle - Daron Offer. Bleeding no bleeding. Bowel function is normal. Bladder function is normal. Patient is not sexually active. Contraception method is Nexplanon. Postpartum depression screening: negative.  The following portions of the patient's history were reviewed and updated as appropriate: allergies, current medications, past family history, past medical history, past social history, past surgical history and problem list.  Review of Systems Pertinent items are noted in HPI.   Objective:    BP (!) 94/59   Pulse 62   Ht 5\' 3"  (1.6 m)   Wt 108 lb 3.2 oz (49.1 kg)   LMP 09/28/2017 (LMP Unknown)   Breastfeeding Yes   BMI 19.17 kg/m   General:  alert, well-appearing   Breasts:  deferred  Lungs: clear to auscultation bilaterally  Heart:  normal heart rate  Abdomen: soft, non-tender   Vulva:  normal  Vagina: normal vagina  Cervix:  extremely retroverted and facing back towards maternal left, normal-appearing without lesions        Assessment:   Routine postpartum exam. Pap smear done at today's visit.   Plan:   1. Contraception: Nexplanon, uncomplicated insertion, see procedure note below 2. Pap smear collected 3. Follow-up as needed  Nexplanon Insertion Procedure Patient identified, informed consent performed, consent signed.   Patient does understand that irregular bleeding is a very common side effect of this medication. She was advised to have backup contraception for one week after placement. Pregnancy test in clinic today was  negative.  Appropriate time out taken.  Patient's left arm was prepped and draped in the usual sterile fashion. The ruler used to measure and mark insertion area.  Patient was prepped with alcohol swab and then injected with 3 ml of 1% lidocaine.  She was prepped with betadine, Nexplanon removed from packaging,  Device confirmed in needle, then inserted full length of needle and withdrawn per handbook instructions. Nexplanon was able to palpated in the patient's arm; patient palpated the insert herself. There was minimal blood loss.  Patient insertion site covered with guaze and a pressure bandage to reduce any bruising.  The patient tolerated the procedure well and was given post procedure instructions.   Cristal Deer. Earlene Plater, DO OB/GYN Fellow

## 2018-08-12 NOTE — Patient Instructions (Signed)
Etonogestrel implant  What is this medicine?  ETONOGESTREL (et oh noe JES trel) is a contraceptive (birth control) device. It is used to prevent pregnancy. It can be used for up to 3 years.  This medicine may be used for other purposes; ask your health care provider or pharmacist if you have questions.  COMMON BRAND NAME(S): Implanon, Nexplanon  What should I tell my health care provider before I take this medicine?  They need to know if you have any of these conditions:  -abnormal vaginal bleeding  -blood vessel disease or blood clots  -breast, cervical, endometrial, ovarian, liver, or uterine cancer  -diabetes  -gallbladder disease  -heart disease or recent heart attack  -high blood pressure  -high cholesterol or triglycerides  -kidney disease  -liver disease  -migraine headaches  -seizures  -stroke  -tobacco smoker  -an unusual or allergic reaction to etonogestrel, anesthetics or antiseptics, other medicines, foods, dyes, or preservatives  -pregnant or trying to get pregnant  -breast-feeding  How should I use this medicine?  This device is inserted just under the skin on the inner side of your upper arm by a health care professional.  Talk to your pediatrician regarding the use of this medicine in children. Special care may be needed.  Overdosage: If you think you have taken too much of this medicine contact a poison control center or emergency room at once.  NOTE: This medicine is only for you. Do not share this medicine with others.  What if I miss a dose?  This does not apply.  What may interact with this medicine?  Do not take this medicine with any of the following medications:  -amprenavir  -fosamprenavir  This medicine may also interact with the following medications:  -acitretin  -aprepitant  -armodafinil  -bexarotene  -bosentan  -carbamazepine  -certain medicines for fungal infections like fluconazole, ketoconazole, itraconazole and voriconazole  -certain medicines to treat hepatitis, HIV or  AIDS  -cyclosporine  -felbamate  -griseofulvin  -lamotrigine  -modafinil  -oxcarbazepine  -phenobarbital  -phenytoin  -primidone  -rifabutin  -rifampin  -rifapentine  -St. John's wort  -topiramate  This list may not describe all possible interactions. Give your health care provider a list of all the medicines, herbs, non-prescription drugs, or dietary supplements you use. Also tell them if you smoke, drink alcohol, or use illegal drugs. Some items may interact with your medicine.  What should I watch for while using this medicine?  This product does not protect you against HIV infection (AIDS) or other sexually transmitted diseases.  You should be able to feel the implant by pressing your fingertips over the skin where it was inserted. Contact your doctor if you cannot feel the implant, and use a non-hormonal birth control method (such as condoms) until your doctor confirms that the implant is in place. Contact your doctor if you think that the implant may have broken or become bent while in your arm.  You will receive a user card from your health care provider after the implant is inserted. The card is a record of the location of the implant in your upper arm and when it should be removed. Keep this card with your health records.  What side effects may I notice from receiving this medicine?  Side effects that you should report to your doctor or health care professional as soon as possible:  -allergic reactions like skin rash, itching or hives, swelling of the face, lips, or tongue  -breast lumps, breast tissue   changes, or discharge  -breathing problems  -changes in emotions or moods  -if you feel that the implant may have broken or bent while in your arm  -high blood pressure  -pain, irritation, swelling, or bruising at the insertion site  -scar at site of insertion  -signs of infection at the insertion site such as fever, and skin redness, pain or discharge  -signs and symptoms of a blood clot such as breathing  problems; changes in vision; chest pain; severe, sudden headache; pain, swelling, warmth in the leg; trouble speaking; sudden numbness or weakness of the face, arm or leg  -signs and symptoms of liver injury like dark yellow or brown urine; general ill feeling or flu-like symptoms; light-colored stools; loss of appetite; nausea; right upper belly pain; unusually weak or tired; yellowing of the eyes or skin  -unusual vaginal bleeding, discharge  Side effects that usually do not require medical attention (report to your doctor or health care professional if they continue or are bothersome):  -acne  -breast pain or tenderness  -headache  -irregular menstrual bleeding  -nausea  This list may not describe all possible side effects. Call your doctor for medical advice about side effects. You may report side effects to FDA at 1-800-FDA-1088.  Where should I keep my medicine?  This drug is given in a hospital or clinic and will not be stored at home.  NOTE: This sheet is a summary. It may not cover all possible information. If you have questions about this medicine, talk to your doctor, pharmacist, or health care provider.   2019 Elsevier/Gold Standard (2017-04-02 14:11:42)

## 2018-08-14 LAB — CYTOLOGY - PAP: Diagnosis: NEGATIVE

## 2018-08-18 ENCOUNTER — Encounter: Payer: Self-pay | Admitting: *Deleted

## 2018-08-20 ENCOUNTER — Encounter: Payer: Self-pay | Admitting: *Deleted

## 2019-04-01 ENCOUNTER — Ambulatory Visit (HOSPITAL_COMMUNITY)
Admission: EM | Admit: 2019-04-01 | Discharge: 2019-04-01 | Disposition: A | Discharge status: Home / Self Care | Payer: Medicaid Other | Attending: Family Medicine | Admitting: Family Medicine

## 2019-04-01 ENCOUNTER — Encounter (HOSPITAL_COMMUNITY): Payer: Self-pay | Admitting: Emergency Medicine

## 2019-04-01 ENCOUNTER — Other Ambulatory Visit: Payer: Self-pay

## 2019-04-01 DIAGNOSIS — Z3202 Encounter for pregnancy test, result negative: Secondary | ICD-10-CM | POA: Diagnosis not present

## 2019-04-01 DIAGNOSIS — R1032 Left lower quadrant pain: Secondary | ICD-10-CM | POA: Diagnosis not present

## 2019-04-01 LAB — POCT URINALYSIS DIP (DEVICE)
Bilirubin Urine: NEGATIVE
Glucose, UA: NEGATIVE mg/dL
Ketones, ur: NEGATIVE mg/dL
Leukocytes,Ua: NEGATIVE
Nitrite: NEGATIVE
Protein, ur: NEGATIVE mg/dL
Specific Gravity, Urine: 1.02 (ref 1.005–1.030)
Urobilinogen, UA: 0.2 mg/dL (ref 0.0–1.0)
pH: 6 (ref 5.0–8.0)

## 2019-04-01 LAB — POCT PREGNANCY, URINE: Preg Test, Ur: NEGATIVE

## 2019-04-01 MED ORDER — DICYCLOMINE HCL 20 MG PO TABS: 20.0000 mg | ORAL_TABLET | Freq: Three times a day (TID) | ORAL | 0 refills | Status: DC

## 2019-04-01 MED ORDER — NAPROXEN 375 MG PO TABS: 375.0000 mg | ORAL_TABLET | Freq: Two times a day (BID) | ORAL | 0 refills | Status: DC

## 2019-04-01 NOTE — Discharge Instructions (Addendum)
Urine normal, pregnancy negative, vaginal swab results pending Please try bentyl before meals and bedtime for your pain as needed May try Naprosyn as needed for cramping as alternative Please drink plenty of fluids Follow up with OBGYN for further evaluation of pain if persisting Follow up if pain not improving or worsening

## 2019-04-01 NOTE — ED Provider Notes (Addendum)
[facility]    CSN: [id number] Arrival date & time: 04/01/19  1514      History   Chief Complaint Chief Complaint  Patient presents with  . Abdominal Pain    HPI [name] interpretation via [employer] interpreter and [employer] interpre[employer] Terri Miller Terri Miller is a 28 y.o. female no significant past medical history presenting today for evaluation of abdominal pain.  Patient states that she has had intermittent abdominal pain for the past 9 months.  States that the pain started after she gave birth 9 months ago.  States that she has a pain that will come and go on her left abdomen.  States that it is very painful and a sharp cramping sensation.  She will feel this sensation approximately 4-5 times per week.  Last approximately 15 minutes when it does occur.  Denies any relieving factors or any specific relieving positions.  She does tend to notice the pain more in the afternoon and especially at nighttime.  She had Nexplanon placed approximately 6 weeks after giving birth.  States that she has noticed her menstrual cycles have been lasting longer than they did prior to pregnancy.  States that they typically last 5 to 10 days versus 5 previously.  She denies any heavier flow than normal.  She denies any pelvic pain or abnormal vaginal discharge.  Denies itching or irritation.  She has had normal bowel movements.  Typically have a bowel movement every other day.  Denies straining.  Had a normal bowel movement this morning.  States that the pain does cause her to have decreased appetite although denies associated nausea or vomiting.  She does feel as if she has lost weight, but she is unsure of how much.  She denies any chest pain or shortness of breath.  She has not tried any medicines for her symptoms.  Patient had vaginal delivery with previous birth 9 months ago.  She denies any changes in her diet.  She states that she does not drink much water/fluids.  HPI  History reviewed. No  pertinent past medical history.  There are no active problems to display for this patient.   History reviewed. No pertinent surgical history.  OB History   No obstetric history on file.      Home Medications    Prior to Admission medications   Medication Sig Start Date End Date Taking? Authorizing Provider  dicyclomine (BENTYL) 20 MG tablet Take 1 tablet (20 mg total) by mouth 4 (four) times daily -  before meals and at bedtime. 04/01/19   ,  C, PA-C  naproxen (NAPROSYN) 375 MG tablet Take 1 tablet (375 mg total) by mouth 2 (two) times daily. 04/01/19   , Junius Creamer, PA-C    Family History Family History  Problem Relation Age of Onset  . Healthy Mother   . Healthy Father     Social History Social History   Tobacco Use  . Smoking status: Never Smoker  Substance Use Topics  . Alcohol use: Yes  . Drug use: Not Currently     Allergies   Patient has no known allergies.   Review of Systems Review of Systems  Constitutional: Positive for appetite change. Negative for activity change, fatigue and fever.  Respiratory: Negative for shortness of breath.   Cardiovascular: Negative for chest pain.  Gastrointestinal: Positive for abdominal pain. Negative for diarrhea, nausea and vomiting.  Genitourinary: Negative for dysuria, flank pain, genital sores, hematuria, menstrual problem, vaginal bleeding, vaginal discharge and  vaginal pain.  Musculoskeletal: Negative for back pain.  Skin: Negative for rash.  Neurological: Negative for dizziness, light-headedness and headaches.     Physical Exam Triage Vital Signs ED Triage Vitals  Enc Vitals Group     BP 04/01/19 1559 108/63     Pulse Rate 04/01/19 1559 (!) 102     Resp 04/01/19 1559 18     Temp 04/01/19 1559 98.3 F (36.8 C)     Temp Source 04/01/19 1559 Oral     SpO2 04/01/19 1559 100 %     Weight --      Height --      Head Circumference --      Peak Flow --      Pain Score 04/01/19 1553 7      Pain Loc --      Pain Edu? --      Excl. in [place]? --    No data found.  Updated Vital Signs BP 108/63 (BP Location: Right Arm)   Pulse (!) 102   Temp 98.3 F (36.8 C) (Oral)   Resp 18   LMP 04/01/2019   SpO2 100%   Visual Acuity Right Eye Distance:   Left Eye Distance:   Bilateral Distance:    Right Eye Near:   Left Eye Near:    Bilateral Near:     Physical Exam Vitals signs and nursing note reviewed.  Constitutional:      General: She is not in acute distress.    Appearance: She is well-developed.  HENT:     Head: Normocephalic and atraumatic.  Eyes:     Conjunctiva/sclera: Conjunctivae normal.  Neck:     Musculoskeletal: Neck supple.  Cardiovascular:     Rate and Rhythm: Normal rate and regular rhythm.     Heart sounds: No murmur.  Pulmonary:     Effort: Pulmonary effort is normal. No respiratory distress.     Breath sounds: Normal breath sounds.     Comments: Breathing comfortably at rest, CTABL, no wheezing, rales or other adventitious sounds auscultated Abdominal:     Palpations: Abdomen is soft.     Tenderness: There is no abdominal tenderness.     Comments: Soft, nondistended, nontender to light and deep palpation throughout entire abdomen  Genitourinary:    Comments: Normal external female genitalia, moderate amount of white/yellowish discharge present, vaginal mucosa pink, cervix nonerythematous, mild discomfort with exam, no cervical motion tenderness Skin:    General: Skin is warm and dry.  Neurological:     General: No focal deficit present.     Mental Status: She is alert and oriented to person, place, and time.      UC Treatments / Results  Labs (all labs ordered are listed, but only abnormal results are displayed) Labs Reviewed  POCT URINALYSIS DIP (DEVICE) - Abnormal; Notable for the following components:      Result Value   Hgb urine dipstick TRACE (*)    All other components within normal limits  POCT PREGNANCY, URINE  CERVICOVAGINAL  ANCILLARY ONLY    EKG   Radiology No results found.  Procedures Procedures (including critical care time)  Medications Ordered in UC Medications - No data to display  Initial Impression / Assessment and Plan / UC Course  I have reviewed the triage vital signs and the nursing notes.  Pertinent labs & imaging results that were available during my care of the patient were reviewed by me and considered in my medical decision making (see chart  for details).     Pregnancy test negative, UA unremarkable, trace hemoglobin present, no signs of leuks or nitrites.  Did obtain vaginal swab given discharge present to rule out any vaginal/infectious causes of intermittent discomfort.  Unclear cause at this time, given initiation of pain after pregnancy, recommending to follow-up with OB/GYN for further evaluation if symptoms persisting.  Consider GI referral as well.  Offered basic labs of CMP/CBC/lipase, patient declined and opted for medication trial first.  Felt this was reasonable as I do not suspect underlying abdominal emergency at this time.  In the meantime will provide Bentyl to use as needed for cramping sensation before meals and bedtime.  Naprosyn as alternative if not being relieved with Bentyl.  Push fluids.Discussed strict return precautions. Patient verbalized understanding and is agreeable with plan.  Final Clinical Impressions(s) / UC Diagnoses   Final diagnoses:  Left lower quadrant abdominal pain     Discharge Instructions     Urine normal, pregnancy negative, vaginal swab results pending Please try bentyl before meals and bedtime for your pain as needed May try Naprosyn as needed for cramping as alternative Please drink plenty of fluids Follow up with OBGYN for further evaluation of pain if persisting Follow up if pain not improving or worsening   ED Prescriptions    Medication Sig Dispense Auth. Provider   dicyclomine (BENTYL) 20 MG tablet Take 1 tablet (20 mg  total) by mouth 4 (four) times daily -  before meals and at bedtime. 20 tablet ,  C, PA-C   naproxen (NAPROSYN) 375 MG tablet Take 1 tablet (375 mg total) by mouth 2 (two) times daily. 20 tablet , [place] C, PA-C     PDMP not reviewed this encounter.   Lew Dawes,  C, PA-C 04/01/19 1921    Lew Dawes,  C, PA-C 04/01/19 1921    Foy [place],  C, PA-C 04/01/19 1922

## 2019-04-01 NOTE — ED Triage Notes (Signed)
Patient has abdominal pain for 9 months.  Patient has not seen a provider .  Pain is intermittent and last only minutes.  Occurs 4-5 times per week, especially at night.  Last bm was this morning and normal.  Reports normal urination

## 2019-04-02 ENCOUNTER — Encounter: Payer: Self-pay | Admitting: Family Medicine

## 2019-04-03 LAB — CERVICOVAGINAL ANCILLARY ONLY
Bacterial vaginitis: NEGATIVE
Candida vaginitis: NEGATIVE
Chlamydia: NEGATIVE
Neisseria Gonorrhea: NEGATIVE
Trichomonas: NEGATIVE

## 2019-04-16 ENCOUNTER — Telehealth: Payer: Self-pay | Admitting: Obstetrics & Gynecology

## 2019-04-16 NOTE — Telephone Encounter (Signed)
Attempted to call patient with Interpreter [phone] to schedule an appointment from her 11/04 visit at the Urgent Care. Per the Interpreter, the number was not working.

## 2019-11-10 ENCOUNTER — Ambulatory Visit (HOSPITAL_COMMUNITY)
Admission: EM | Admit: 2019-11-10 | Discharge: 2019-11-10 | Disposition: A | Discharge status: Home / Self Care | Payer: Medicaid Other | Attending: Physician Assistant | Admitting: Physician Assistant

## 2019-11-10 ENCOUNTER — Other Ambulatory Visit: Payer: Self-pay

## 2019-11-10 ENCOUNTER — Encounter (HOSPITAL_COMMUNITY): Payer: Self-pay

## 2019-11-10 DIAGNOSIS — R002 Palpitations: Secondary | ICD-10-CM | POA: Diagnosis not present

## 2019-11-10 DIAGNOSIS — H6501 Acute serous otitis media, right ear: Secondary | ICD-10-CM | POA: Diagnosis not present

## 2019-11-10 DIAGNOSIS — J309 Allergic rhinitis, unspecified: Secondary | ICD-10-CM

## 2019-11-10 MED ORDER — FLUTICASONE PROPIONATE 50 MCG/ACT NA SUSP: 1.0000 | Freq: Every day | NASAL | 0 refills | Status: DC

## 2019-11-10 MED ORDER — CETIRIZINE HCL 10 MG PO TABS: 10.0000 mg | ORAL_TABLET | Freq: Every day | ORAL | 0 refills | Status: DC

## 2019-11-10 NOTE — Discharge Instructions (Addendum)
utiliser le spray nasal quotidiennement  prendre le zyrtec une fois par jour  appelez la clinique de cardiologie pour un suivi des problmes cardiaques  si les symptmes cardiaques s'aggravent, veuillez vous rendre au service des urgences  j'aimerais aussi que vous appeliez le groupe de mdecine familiale pour obtenir un fournisseur de soins primaires   use the nasal spray daily  take the zyrtec once daily  call the cardiology clinic for follow up on heart concerns  if heart symptoms worsen, please go to the Emergency Department  i would also like for you to call the family medicine group to get a primary care provider

## 2019-11-10 NOTE — ED Provider Notes (Signed)
[facility]    CSN: [id number] Arrival date & time: 11/10/19  1200      History   Chief Complaint Chief Complaint  Patient presents with  . Otalgia  . Headache    HPI Terri Miller is a 29 y.o. female.   [name] interpreter is utilized for the duration of visit  Patient presents for 2 primary concerns.  The first being ear pain for the last 1 week.  She reports the pain is in the right ear and this is been getting worse over the last week.  She reports this causes pain inside of her head.  Reports pain sharp at times, though I believe there is some confusion through translation of describing the pain.  She has not tried any medicines for the ear pain.  She has not had fever or chills.  She has had some mild stuffy nose.  No sneezing.  No runny nose.  No cough.  No sore throat.  No change in hearing.  Her other concern today is reported as heart racing at night with some discomfort.  There is some language barrier with regard to describing her heart symptoms however it is reported that her heart is beating fast and there is pain at night.  She reports this lasts 5 minutes and then goes away.  She reports this can happen a few times per night.  She denies any nausea or sweating when this occurs.  She does not get short of breath when this occurs.  She reports this can also occur during the day.  Is not currently occurring in the clinic and has not happened today.  She has never been evaluated for this before.  She is denying chest pain or shortness of breath in clinic.        Past Medical History:  Diagnosis Date  . Medical history non-contributory     Patient Active Problem List   Diagnosis Date Noted  . Language barrier 01/09/2018    Past Surgical History:  Procedure Laterality Date  . NO PAST SURGERIES      OB History    Gravida  5   Para  3   Term  3   Preterm  0   AB  2   Living  2     SAB  2   TAB  0   Ectopic  0    Multiple      Live Births  2            Home Medications    Prior to Admission medications   Medication Sig Start Date End Date Taking? Authorizing Provider  cetirizine (ZYRTEC ALLERGY) 10 MG tablet Take 1 tablet (10 mg total) by mouth daily. 11/10/19 12/10/19  Avrey Hyser, Veryl Speak, PA-C  dicyclomine (BENTYL) 20 MG tablet Take 1 tablet (20 mg total) by mouth 4 (four) times daily -  before meals and at bedtime. 04/01/19   Wieters, Hallie C, PA-C  ferrous fumarate (HEMOCYTE - 106 MG FE) 325 (106 Fe) MG TABS tablet Take 1 tablet (106 mg of iron total) by mouth daily. 04/29/18   Marylene Land, CNM  fluticasone (FLONASE) 50 MCG/ACT nasal spray Place 1 spray into both nostrils daily. 11/10/19 12/10/19  Caspian Deleonardis, Veryl Speak, PA-C  naproxen (NAPROSYN) 375 MG tablet Take 1 tablet (375 mg total) by mouth 2 (two) times daily. 04/01/19   Wieters, Hallie C, PA-C  Prenatal Multivit-Min-Fe-FA (PRENATAL VITAMINS) 0.8 MG tablet Take 1 tablet by  mouth daily. 01/06/18   Armando Reichert, CNM    Family History Family History  Problem Relation Age of Onset  . Healthy Mother   . Healthy Father     Social History Social History   Tobacco Use  . Smoking status: Never Smoker  . Smokeless tobacco: Never Used  Substance Use Topics  . Alcohol use: Yes  . Drug use: Not Currently     Allergies   Patient has no known allergies.   Review of Systems Review of Systems   Physical Exam Triage Vital Signs ED Triage Vitals [11/10/19 1228]  Enc Vitals Group     BP      Pulse      Resp      Temp      Temp src      SpO2      Weight      Height      Head Circumference      Peak Flow      Pain Score 6     Pain Loc      Pain Edu?      Excl. in [place]?    No data found.  Updated Vital Signs BP 105/62 (BP Location: Right Arm)   Pulse 65   Temp (!) 97.5 F (36.4 C) (Oral)   SpO2 100%   Visual Acuity Right Eye Distance:   Left Eye Distance:   Bilateral Distance:    Right Eye Near:   Left Eye  Near:    Bilateral Near:     Physical Exam Vitals and nursing note reviewed.  Constitutional:      General: She is not in acute distress.    Appearance: She is well-developed. She is not ill-appearing.  HENT:     Head: Normocephalic and atraumatic.     Right Ear: Ear canal and external ear normal.     Left Ear: Ear canal and external ear normal.     Ears:     Comments: Serous effusions bilaterally right greater than left.   no pain with manipulation of the tragus or auricle bilaterally    Nose: Congestion present.     Comments: Turbinates are pale and boggy    Mouth/Throat:     Mouth: Mucous membranes are moist.     Pharynx: Oropharynx is clear.  Eyes:     Extraocular Movements: Extraocular movements intact.     Conjunctiva/sclera: Conjunctivae normal.     Pupils: Pupils are equal, round, and reactive to light.  Cardiovascular:     Rate and Rhythm: Normal rate and regular rhythm.     Heart sounds: No murmur heard.  No friction rub. No gallop.   Pulmonary:     Effort: Pulmonary effort is normal. No respiratory distress.     Breath sounds: Normal breath sounds.  Musculoskeletal:     Cervical back: Neck supple.  Skin:    General: Skin is warm and dry.  Neurological:     Mental Status: She is alert.      UC Treatments / Results  Labs (all labs ordered are listed, but only abnormal results are displayed) Labs Reviewed - No data to display  EKG   Radiology No results found.  Procedures Procedures (including critical care time)  Medications Ordered in UC Medications - No data to display  Initial Impression / Assessment and Plan / UC Course  I have reviewed the triage vital signs and the nursing notes.  Pertinent labs & imaging results that were  available during my care of the patient were reviewed by me and considered in my medical decision making (see chart for details).     #Palpitations #Allergic rhinitis #Serous otitis Patient is a 29 year old female  presenting with history of palpitations, right-sided serous otitis media likely secondary to her chronic allergic rhinitis.  She is asymptomatic with regard to her palpitations in clinic with a normal rate and rhythm on exam, will defer EKG however we will have her follow-up with cardiology for further evaluation.  Will trial Flonase and Zyrtec for nasal signs and symptoms and needed pressure relief of of ear.  Also discussed use of over-the-counter pain medicines.  Strict emergency department precautions were discussed.  Extensive time was taken to ensure patient fully understands the plan of care.  She does not currently have a primary care recommended family medicine establishment.  She verbalized understanding of the plan of care.  [place] interpreter is utilized for the duration of this visit Final Clinical Impressions(s) / UC Diagnoses   Final diagnoses:  Right acute serous otitis media, recurrence not specified  Palpitations  Allergic rhinitis, unspecified seasonality, unspecified trigger     Discharge Instructions     utiliser le spray nasal quotidiennement  prendre le zyrtec une fois par jour  appelez la clinique de cardiologie pour un suivi des problmes cardiaques  si les symptmes cardiaques s'aggravent, veuillez vous rendre au service des urgences  j'aimerais aussi que vous appeliez le groupe de mdecine familiale pour obtenir un fournisseur de soins primaires   use the nasal spray daily  take the zyrtec once daily  call the cardiology clinic for follow up on heart concerns  if heart symptoms worsen, please go to the Emergency Department  i would also like for you to call the family medicine group to get a primary care provider      ED Prescriptions    Medication Sig Dispense Auth. Provider   fluticasone (FLONASE) 50 MCG/ACT nasal spray Place 1 spray into both nostrils daily. 11.1 mL Shanena Pellegrino, Marguerita Beards, PA-C   cetirizine (ZYRTEC ALLERGY) 10 MG tablet Take 1 tablet (10  mg total) by mouth daily. 30 tablet Latasha Puskas, Marguerita Beards, PA-C     PDMP not reviewed this encounter.   Purnell Shoemaker, PA-C 11/10/19 2337

## 2019-11-10 NOTE — ED Triage Notes (Signed)
Pt reports bilateral ear pain and headache x 1 week. Pt states he heart feels like running at night. Pt denies chest pain.

## 2019-11-11 ENCOUNTER — Encounter (HOSPITAL_COMMUNITY): Payer: Self-pay | Admitting: *Deleted

## 2019-11-11 ENCOUNTER — Emergency Department (HOSPITAL_COMMUNITY)
Admission: EM | Admit: 2019-11-11 | Discharge: 2019-11-11 | Disposition: A | Discharge status: Home / Self Care | Payer: Medicaid Other | Attending: Emergency Medicine | Admitting: Emergency Medicine

## 2019-11-11 DIAGNOSIS — K0889 Other specified disorders of teeth and supporting structures: Secondary | ICD-10-CM | POA: Diagnosis not present

## 2019-11-11 MED ORDER — PENICILLIN V POTASSIUM 500 MG PO TABS: 500.0000 mg | ORAL_TABLET | Freq: Four times a day (QID) | ORAL | 0 refills | Status: AC

## 2019-11-11 NOTE — ED Triage Notes (Signed)
Pt seen and treated for rt ear pain at urgent care, given meds. Today has rt side dental pain

## 2019-11-11 NOTE — ED Provider Notes (Signed)
[facility] DEPT Provider Note   CSN: [id number] Arrival date & time: 11/11/19  0555     History Chief Complaint  Patient presents with  . Dental Pain    Terri Miller is a 29 y.o. female.  HPI Patient is a 29 year old female who presents with dental pain.  Patient states her pain started last night.  Her pain worsens with chewing.  Patient was seen in urgent care yesterday and had right ear pain at the time.  She was discharged with Flonase as well as cetirizine.  They recommended PCP follow-up.  She did not have any dental pain at that time.  She reports a history of similar dental pain that typically spontaneously alleviates.  She has not taken anything for symptoms.  She denies having a dental provider in the area.  She denies headache, sore throat, fevers, chills, chest pain, shortness of breath, difficulty swallowing, drooling.    Past Medical History:  Diagnosis Date  . Medical history non-contributory     Patient Active Problem List   Diagnosis Date Noted  . Language barrier 01/09/2018    Past Surgical History:  Procedure Laterality Date  . NO PAST SURGERIES       OB History    Gravida  5   Para  3   Term  3   Preterm  0   AB  2   Living  2     SAB  2   TAB  0   Ectopic  0   Multiple      Live Births  2           Family History  Problem Relation Age of Onset  . Healthy Mother   . Healthy Father     Social History   Tobacco Use  . Smoking status: Never Smoker  . Smokeless tobacco: Never Used  Substance Use Topics  . Alcohol use: Yes  . Drug use: Not Currently    Home Medications Prior to Admission medications   Medication Sig Start Date End Date Taking? Authorizing Provider  cetirizine (ZYRTEC ALLERGY) 10 MG tablet Take 1 tablet (10 mg total) by mouth daily. 11/10/19 12/10/19  Darr, Marguerita Beards, PA-C  dicyclomine (BENTYL) 20 MG tablet Take 1 tablet (20 mg total) by mouth 4 (four) times  daily -  before meals and at bedtime. 04/01/19   Wieters, Hallie C, PA-C  ferrous fumarate (HEMOCYTE - 106 MG FE) 325 (106 Fe) MG TABS tablet Take 1 tablet (106 mg of iron total) by mouth daily. 04/29/18   Starr [place], CNM  fluticasone (FLONASE) 50 MCG/ACT nasal spray Place 1 spray into both nostrils daily. 11/10/19 12/10/19  Darr, Marguerita Beards, PA-C  naproxen (NAPROSYN) 375 MG tablet Take 1 tablet (375 mg total) by mouth 2 (two) times daily. 04/01/19   Wieters, Hallie C, PA-C  Prenatal Multivit-Min-Fe-FA (PRENATAL VITAMINS) 0.8 MG tablet Take 1 tablet by mouth daily. 01/06/18   Tresea Mall, CNM    Allergies    Patient has no known allergies.  Review of Systems   Review of Systems  Constitutional: Negative for chills and fever.  HENT: Positive for dental problem. Negative for congestion, drooling, ear discharge, ear pain, postnasal drip, rhinorrhea and sore throat.   Respiratory: Negative for shortness of breath.   Cardiovascular: Negative for chest pain.   Physical Exam Updated Vital Signs BP 104/73   Pulse 82   Temp 98.2 F (36.8 C) (Oral)  Resp 16   SpO2 99%   Physical Exam Vitals and nursing note reviewed.  Constitutional:      General: She is not in acute distress.    Appearance: Normal appearance. She is not ill-appearing, toxic-appearing or diaphoretic.  HENT:     Head: Normocephalic and atraumatic.     Right Ear: External ear normal.     Left Ear: External ear normal.     Nose: Nose normal.     Mouth/Throat:     Mouth: Mucous membranes are moist.     Pharynx: Oropharynx is clear. No oropharyngeal exudate or posterior oropharyngeal erythema.     Comments: Mild TTP with manipulation of the right lower third molar.  No visible abscess.  No region of fluctuance palpated.  No discharge.  Uvula midline.  No erythema noted in the posterior oropharynx.  Patient is readily handling secretions.  Soft compartments.  No neck pain. Eyes:     Extraocular Movements:  Extraocular movements intact.  Cardiovascular:     Rate and Rhythm: Normal rate and regular rhythm.     Pulses: Normal pulses.     Heart sounds: Normal heart sounds. No murmur heard.  No friction rub. No gallop.   Pulmonary:     Effort: Pulmonary effort is normal. No respiratory distress.     Breath sounds: Normal breath sounds. No stridor. No wheezing, rhonchi or rales.  Abdominal:     General: Abdomen is flat.     Tenderness: There is no abdominal tenderness.  Musculoskeletal:        General: Normal range of motion.     Cervical back: Normal range of motion and neck supple. No tenderness.  Skin:    General: Skin is warm and dry.  Neurological:     General: No focal deficit present.     Mental Status: She is alert and oriented to person, place, and time.  Psychiatric:        Mood and Affect: Mood normal.        Behavior: Behavior normal.    ED Results / Procedures / Treatments   Labs (all labs ordered are listed, but only abnormal results are displayed) Labs Reviewed - No data to display  EKG None  Radiology No results found.  Procedures Procedures   Medications Ordered in ED Medications - No data to display  ED Course  I have reviewed the triage vital signs and the nursing notes.  Pertinent labs & imaging results that were available during my care of the patient were reviewed by me and considered in my medical decision making (see chart for details).    MDM Rules/Calculators/A&P                          Patient is a 29 year old female that presents with right lower dental pain.  Physical exam is extremely reassuring.  No visible abscess or palpable fluctuance.  Patient is not tachycardic.  Afebrile.  No signs or symptoms consistent with Ludwig's angina.  She is saturating well.  Not tachypneic.  She is speaking in clear and complete sentences.  We will discharge patient on a short course of penicillin.  We discussed her need for dental follow-up as well as PCP  follow-up based on her visit to urgent care yesterday for ear pain.  She was given a Facilities manager for dental providers in the area.  Recommended ibuprofen and Tylenol for pain management.  Discussed safety regarding this medication.  Her questions were answered and she was amicable at the time of discharge.  Her vital signs are stable.  Patient discharged to home/self care.  Condition at discharge: Stable  Note: Portions of this report may have been transcribed using voice recognition software. Every effort was made to ensure accuracy; however, inadvertent computerized transcription errors may be present.    Final Clinical Impression(s) / ED Diagnoses Final diagnoses:  Pain, dental    Rx / DC Orders ED Discharge Orders         Ordered    penicillin v potassium (VEETID) 500 MG tablet  4 times daily     Discontinue  Reprint     11/11/19 0658           Placido Sou, PA-C 11/11/19 0700    Long, Arlyss Repress, MD 11/12/19 [phone]

## 2019-11-11 NOTE — Discharge Instructions (Signed)
Je t'ai prescrit un mdicament appel pnicilline. C'est un antibiotique que vous allez prendre 4 fois par jour pendant les 5 prochains jours. Veuillez prendre l'antibiotique dans son intgralit. S'il vous plat ne vous arrtez pas tt.  Vous pouvez continuer  prendre du Tylenol et de l'ibuprofne pour la gestion de Nurse, adult. Veuillez suivre les instructions sur la bouteille.  J'ai joint une liste d'informations concernant les dentistes locaux. Veuillez contacter les personnes figurant sur cette liste pour trouver un fournisseur de soins dentaires dans votre rgion.  Si vos symptmes s'aggravent, vous pouvez toujours retourner [employer].  Ce fut un plaisir de Patent attorney.  Je recommanderais d'appliquer des coussins [employer] zone de votre bouche qui vous drange.

## 2020-08-12 ENCOUNTER — Ambulatory Visit (HOSPITAL_COMMUNITY)
Admission: EM | Admit: 2020-08-12 | Discharge: 2020-08-12 | Disposition: A | Discharge status: Home / Self Care | Payer: Medicaid Other | Attending: Emergency Medicine | Admitting: Emergency Medicine

## 2020-08-12 ENCOUNTER — Encounter (HOSPITAL_COMMUNITY): Payer: Self-pay

## 2020-08-12 ENCOUNTER — Other Ambulatory Visit: Payer: Self-pay

## 2020-08-12 DIAGNOSIS — N939 Abnormal uterine and vaginal bleeding, unspecified: Secondary | ICD-10-CM | POA: Diagnosis present

## 2020-08-12 DIAGNOSIS — Z3202 Encounter for pregnancy test, result negative: Secondary | ICD-10-CM | POA: Diagnosis not present

## 2020-08-12 LAB — HEMOGLOBIN AND HEMATOCRIT, BLOOD
HCT: 38.8 % (ref 36.0–46.0)
Hemoglobin: 13.3 g/dL (ref 12.0–15.0)

## 2020-08-12 LAB — POC URINE PREG, ED: Preg Test, Ur: NEGATIVE

## 2020-08-12 MED ORDER — NAPROXEN 500 MG PO TABS: 500.0000 mg | ORAL_TABLET | Freq: Two times a day (BID) | ORAL | 0 refills | Status: DC

## 2020-08-12 MED ORDER — MEGESTROL ACETATE 40 MG PO TABS: 40.0000 mg | ORAL_TABLET | Freq: Two times a day (BID) | ORAL | 0 refills | Status: DC

## 2020-08-12 NOTE — Discharge Instructions (Addendum)
Please start megace twice a day to stop your bleeding.  Naproxen twice a day as needed to help with pain, take with food.  Please follow up with gynecology for recheck and long term management.

## 2020-08-12 NOTE — ED Triage Notes (Signed)
Pt presents with abnormal vaginal bleeding & cramping X 3 weeks; pt states her birth control implant has been placed since March 2020.

## 2020-08-12 NOTE — ED Provider Notes (Signed)
[facility]    CSN: [id number] Arrival date & time: 08/12/20  1712      History   Chief Complaint Chief Complaint  Patient presents with  . Vaginal Bleeding  . Abdominal Pain    HPI Terri Miller Ousseini Blaine Hamper is a 30 y.o. female.   Terri Miller presents with complaints of vaginal bleeding for the past three weeks, which is abnormal for her. She previously would typically have around 5 days of bleeding monthly. She had no period for around 2-3 months, and then started bleeding three weeks ago and hasn't stopped. Can be heavy at times and needs to change a pad in less than an hour, waxes and wanes. Pelvic cramping. States she is not sexually active. No dizziness specifically. No fevers. No urinary symptoms.  She has a nexplanon in place, placed in 2020.     ROS per HPI, negative if not otherwise mentioned.      Past Medical History:  Diagnosis Date  . Medical history non-contributory     Patient Active Problem List   Diagnosis Date Noted  . Language barrier 01/09/2018    Past Surgical History:  Procedure Laterality Date  . NO PAST SURGERIES      OB History    Gravida  5   Para  3   Term  3   Preterm  0   AB  2   Living  2     SAB  2   IAB  0   Ectopic  0   Multiple      Live Births  2            Home Medications    Prior to Admission medications   Medication Sig Start Date End Date Taking? Authorizing Provider  megestrol (MEGACE) 40 MG tablet Take 1 tablet (40 mg total) by mouth 2 (two) times daily. 08/12/20  Yes Haylen Bellotti, Dorene Grebe B, NP  naproxen (NAPROSYN) 500 MG tablet Take 1 tablet (500 mg total) by mouth 2 (two) times daily. 08/12/20  Yes Georgetta Haber, NP  cetirizine (ZYRTEC ALLERGY) 10 MG tablet Take 1 tablet (10 mg total) by mouth daily. 11/10/19 12/10/19  Darr, Gerilyn Pilgrim, PA-C  dicyclomine (BENTYL) 20 MG tablet Take 1 tablet (20 mg total) by mouth 4 (four) times daily -  before meals and at bedtime. 04/01/19    Wieters, Hallie C, PA-C  ferrous fumarate (HEMOCYTE - 106 MG FE) 325 (106 Fe) MG TABS tablet Take 1 tablet (106 mg of iron total) by mouth daily. 04/29/18   Marylene Land, CNM  fluticasone (FLONASE) 50 MCG/ACT nasal spray Place 1 spray into both nostrils daily. 11/10/19 12/10/19  Darr, Gerilyn Pilgrim, PA-C  Prenatal Multivit-Min-Fe-FA (PRENATAL VITAMINS) 0.8 MG tablet Take 1 tablet by mouth daily. 01/06/18   Armando Reichert, CNM    Family History Family History  Problem Relation Age of Onset  . Healthy Mother   . Healthy Father     Social History Social History   Tobacco Use  . Smoking status: Never Smoker  . Smokeless tobacco: Never Used  Substance Use Topics  . Alcohol use: Yes  . Drug use: Not Currently     Allergies   Patient has no known allergies.   Review of Systems Review of Systems   Physical Exam Triage Vital Signs ED Triage Vitals [08/12/20 1732]  Enc Vitals Group     BP 107/61     Pulse Rate 79     Resp  18     Temp 98.1 F (36.7 C)     Temp Source Oral     SpO2 100 %     Weight      Height      Head Circumference      Peak Flow      Pain Score 4     Pain Loc      Pain Edu?      Excl. in GC?    No data found.  Updated Vital Signs BP 107/61 (BP Location: Right Arm)   Pulse 79   Temp 98.1 F (36.7 C) (Oral)   Resp 18   SpO2 100%   Visual Acuity Right Eye Distance:   Left Eye Distance:   Bilateral Distance:    Right Eye Near:   Left Eye Near:    Bilateral Near:     Physical Exam Constitutional:      General: She is not in acute distress.    Appearance: She is well-developed.  HENT:     Head: Normocephalic and atraumatic.  Cardiovascular:     Rate and Rhythm: Normal rate.  Pulmonary:     Effort: Pulmonary effort is normal.  Abdominal:     Palpations: Abdomen is soft.     Tenderness: There is abdominal tenderness in the suprapubic area.     Comments: Very mild suprapubic tenderness on palpation  Genitourinary:     Comments: Deferred; denies ulcers/lesions and endorses vaginal bleeding  Skin:    General: Skin is warm and dry.  Neurological:     Mental Status: She is alert and oriented to person, place, and time.      UC Treatments / Results  Labs (all labs ordered are listed, but only abnormal results are displayed) Labs Reviewed  HEMOGLOBIN AND HEMATOCRIT, BLOOD  POC URINE PREG, ED  CERVICOVAGINAL ANCILLARY ONLY    EKG   Radiology No results found.  Procedures Procedures (including critical care time)  Medications Ordered in UC Medications - No data to display  Initial Impression / Assessment and Plan / UC Course  I have reviewed the triage vital signs and the nursing notes.  Pertinent labs & imaging results that were available during my care of the patient were reviewed by me and considered in my medical decision making (see chart for details).     Abnormal vaginal bleeding. Vitals stable. Negative pregnancy. H&H stable. Megace initiated tonight with gyne follow up recommended. Return precautions provided. Patient verbalized understanding and agreeable to plan.   Final Clinical Impressions(s) / UC Diagnoses   Final diagnoses:  Abnormal vaginal bleeding     Discharge Instructions     Please start megace twice a day to stop your bleeding.  Naproxen twice a day as needed to help with pain, take with food.  Please follow up with gynecology for recheck and long term management.    ED Prescriptions    Medication Sig Dispense Auth. Provider   naproxen (NAPROSYN) 500 MG tablet Take 1 tablet (500 mg total) by mouth 2 (two) times daily. 30 tablet Linus Mako B, NP   megestrol (MEGACE) 40 MG tablet Take 1 tablet (40 mg total) by mouth 2 (two) times daily. 20 tablet Georgetta Haber, NP     PDMP not reviewed this encounter.   Georgetta Haber, NP 08/12/20 [phone]

## 2020-08-15 LAB — CERVICOVAGINAL ANCILLARY ONLY
Bacterial Vaginitis (gardnerella): NEGATIVE
Candida Glabrata: NEGATIVE
Candida Vaginitis: NEGATIVE
Chlamydia: NEGATIVE
Comment: NEGATIVE
Comment: NEGATIVE
Comment: NEGATIVE
Comment: NEGATIVE
Comment: NEGATIVE
Comment: NORMAL
Neisseria Gonorrhea: NEGATIVE
Trichomonas: NEGATIVE

## 2020-09-29 ENCOUNTER — Other Ambulatory Visit: Payer: Self-pay

## 2020-09-29 ENCOUNTER — Ambulatory Visit (INDEPENDENT_AMBULATORY_CARE_PROVIDER_SITE_OTHER): Payer: Medicaid Other | Admitting: Obstetrics and Gynecology

## 2020-09-29 ENCOUNTER — Encounter: Payer: Self-pay | Admitting: Obstetrics and Gynecology

## 2020-09-29 VITALS — BP 112/68 | HR 103 | Ht 61.0 in | Wt 141.9 lb

## 2020-09-29 DIAGNOSIS — Z30011 Encounter for initial prescription of contraceptive pills: Secondary | ICD-10-CM | POA: Diagnosis not present

## 2020-09-29 DIAGNOSIS — Z309 Encounter for contraceptive management, unspecified: Secondary | ICD-10-CM | POA: Insufficient documentation

## 2020-09-29 DIAGNOSIS — Z3046 Encounter for surveillance of implantable subdermal contraceptive: Secondary | ICD-10-CM | POA: Diagnosis not present

## 2020-09-29 DIAGNOSIS — Z789 Other specified health status: Secondary | ICD-10-CM | POA: Diagnosis not present

## 2020-09-29 MED ORDER — DESOGESTREL-ETHINYL ESTRADIOL 0.15-30 MG-MCG PO TABS: 1.0000 | ORAL_TABLET | Freq: Every day | ORAL | 11 refills | Status: DC

## 2020-09-29 NOTE — Progress Notes (Signed)
Anaiza Saidou Azarya Oconnell is a 30 y.o. [phone] here for Nexplanon removal.   Nexplanon Removal Patient identified, informed consent performed, consent signed.   Appropriate time out taken. Nexplanon site identified.  Area prepped in usual sterile fashon. One ml of 1% lidocaine was used to anesthetize the area at the distal end of the implant. A small stab incision was made right beside the implant on the distal portion.  The Nexplanon rod was grasped using hemostats and removed without difficulty.  There was minimal blood loss. There were no complications.  3 ml of 1% lidocaine was injected around the incision for post-procedure analgesia.  Steri-strips were applied over the small incision.  A pressure bandage was applied to reduce any bruising.  The patient tolerated the procedure well and was given post procedure instructions.  Patient is planning to use OCP's for contraception/attempt conception. Rx provided. U/R/B and back up method reviewed.   Hermina Staggers, MD, FACOG Attending Obstetrician & Gynecologist Center for [facility], [facility], [facility]

## 2020-10-31 NOTE — Progress Notes (Signed)
Screened 09/29/20 

## 2020-11-25 ENCOUNTER — Ambulatory Visit (HOSPITAL_COMMUNITY)
Admission: EM | Admit: 2020-11-25 | Discharge: 2020-11-25 | Disposition: A | Discharge status: Home / Self Care | Payer: Medicaid Other | Attending: Physician Assistant | Admitting: Physician Assistant

## 2020-11-25 ENCOUNTER — Encounter (HOSPITAL_COMMUNITY): Payer: Self-pay | Admitting: Emergency Medicine

## 2020-11-25 ENCOUNTER — Other Ambulatory Visit: Payer: Self-pay

## 2020-11-25 DIAGNOSIS — K0889 Other specified disorders of teeth and supporting structures: Secondary | ICD-10-CM

## 2020-11-25 DIAGNOSIS — K047 Periapical abscess without sinus: Secondary | ICD-10-CM

## 2020-11-25 DIAGNOSIS — H9202 Otalgia, left ear: Secondary | ICD-10-CM | POA: Diagnosis not present

## 2020-11-25 MED ORDER — AMOXICILLIN-POT CLAVULANATE 875-125 MG PO TABS: 1.0000 | ORAL_TABLET | Freq: Two times a day (BID) | ORAL | 0 refills | Status: DC

## 2020-11-25 NOTE — ED Provider Notes (Signed)
[facility]    CSN: [id number] Arrival date & time: 11/25/20  0801      History   Chief Complaint Chief Complaint  Patient presents with   Dental Pain    Left jaw   Otalgia    HPI Terri Miller is a 30 y.o. female.   Patient presents today with a 2-day history of worsening dental pain.  Reports the pain is rated 8 on a 0-10 pain scale, localized to left lower posterior molar with radiation towards left ear, described as throbbing, worse with mastication, no alleviating factors identified.  She has not tried any over-the-counter medications for symptom management.  She denies any recent dental work but does have a history of dental problems.  She is having difficulty eating as chewing is painful but has been able to drink without restriction.  She denies any throat/tongue swelling, muffled voice, dysphagia, fever, nausea, vomiting.  She denies any recent antibiotic use.   Past Medical History:  Diagnosis Date   Medical history non-contributory     Patient Active Problem List   Diagnosis Date Noted   Nexplanon removal 09/29/2020   Contraception management 09/29/2020   Language barrier 01/09/2018    Past Surgical History:  Procedure Laterality Date   NO PAST SURGERIES      OB History     Gravida  5   Para  3   Term  3   Preterm  0   AB  2   Living  2      SAB  2   IAB  0   Ectopic  0   Multiple      Live Births  2            Home Medications    Prior to Admission medications   Medication Sig Start Date End Date Taking? Authorizing Provider  amoxicillin-clavulanate (AUGMENTIN) 875-125 MG tablet Take 1 tablet by mouth every 12 (twelve) hours. 11/25/20  Yes Grayer Sproles, Noberto Retort, PA-C  desogestrel-ethinyl estradiol (APRI) 0.15-30 MG-MCG tablet Take 1 tablet by mouth daily. 09/29/20  Yes Hermina Staggers, MD  cetirizine (ZYRTEC ALLERGY) 10 MG tablet Take 1 tablet (10 mg total) by mouth daily. 11/10/19 12/10/19  Darr, Gerilyn Pilgrim, PA-C   fluticasone (FLONASE) 50 MCG/ACT nasal spray Place 1 spray into both nostrils daily. 11/10/19 12/10/19  Darr, Gerilyn Pilgrim, PA-C    Family History Family History  Problem Relation Age of Onset   Healthy Mother    Healthy Father     Social History Social History   Tobacco Use   Smoking status: Never   Smokeless tobacco: Never  Substance Use Topics   Alcohol use: Yes   Drug use: Not Currently     Allergies   Patient has no known allergies.   Review of Systems Review of Systems  Constitutional:  Positive for activity change and appetite change. Negative for fatigue and fever.  HENT:  Positive for dental problem, ear pain and facial swelling. Negative for congestion, sinus pressure, sneezing, sore throat, trouble swallowing and voice change.   Respiratory:  Negative for cough and shortness of breath.   Cardiovascular:  Negative for chest pain.  Gastrointestinal:  Negative for abdominal pain, diarrhea, nausea and vomiting.  Musculoskeletal:  Negative for arthralgias and myalgias.  Neurological:  Negative for dizziness, light-headedness and headaches.    Physical Exam Triage Vital Signs ED Triage Vitals  Enc Vitals Group     BP 11/25/20 0819 113/78     Pulse  Rate 11/25/20 0819 84     Resp 11/25/20 0819 16     Temp 11/25/20 0819 98.6 F (37 C)     Temp Source 11/25/20 0819 Oral     SpO2 11/25/20 0819 100 %     Weight --      Height --      Head Circumference --      Peak Flow --      Pain Score 11/25/20 0817 8     Pain Loc --      Pain Edu? --      Excl. in GC? --    No data found.  Updated Vital Signs BP 113/78   Pulse 84   Temp 98.6 F (37 C) (Oral)   Resp 16   LMP 11/06/2020   SpO2 100%   Visual Acuity Right Eye Distance:   Left Eye Distance:   Bilateral Distance:    Right Eye Near:   Left Eye Near:    Bilateral Near:     Physical Exam Vitals reviewed.  Constitutional:      General: She is awake. She is not in acute distress.    Appearance: Normal  appearance. She is normal weight. She is not ill-appearing.     Comments: Very pleasant female appears stated age in no acute distress sitting comfortably on exam table  HENT:     Head: Normocephalic and atraumatic.     Right Ear: Tympanic membrane, ear canal and external ear normal. Tympanic membrane is not erythematous or bulging.     Left Ear: Tympanic membrane, ear canal and external ear normal. Tympanic membrane is not erythematous or bulging.     Nose:     Right Sinus: No maxillary sinus tenderness or frontal sinus tenderness.     Left Sinus: Maxillary sinus tenderness present. No frontal sinus tenderness.     Mouth/Throat:     Mouth: Mucous membranes are moist.     Dentition: Abnormal dentition. Dental tenderness, gingival swelling and dental abscesses present.     Pharynx: Uvula midline. No oropharyngeal exudate or posterior oropharyngeal erythema.      Comments: No evidence of Ludwig angina Cardiovascular:     Rate and Rhythm: Normal rate and regular rhythm.     Heart sounds: Normal heart sounds, S1 normal and S2 normal. No murmur heard. Pulmonary:     Effort: Pulmonary effort is normal.     Breath sounds: Normal breath sounds. No wheezing, rhonchi or rales.     Comments: Clear to auscultation bilaterally Lymphadenopathy:     Head:     Right side of head: No submental, submandibular or tonsillar adenopathy.     Left side of head: No submental, submandibular or tonsillar adenopathy.     Cervical: No cervical adenopathy.  Psychiatric:        Behavior: Behavior is cooperative.     UC Treatments / Results  Labs (all labs ordered are listed, but only abnormal results are displayed) Labs Reviewed - No data to display  EKG   Radiology No results found.  Procedures Procedures (including critical care time)  Medications Ordered in UC Medications - No data to display  Initial Impression / Assessment and Plan / UC Course  I have reviewed the triage vital signs and the  nursing notes.  Pertinent labs & imaging results that were available during my care of the patient were reviewed by me and considered in my medical decision making (see chart for details).  Patient prescribed Augmentin twice daily for 7 days to cover for dental infection.  Recommended she use an ice pack to help improve swelling and pain.  She is alternate over-the-counter medications for pain relief including Tylenol and ibuprofen.  Discussed the importance of using backup birth control with antibiotics as this can decrease the effectiveness of OCP.  Patient has no concern for pregnancy and is not currently breast-feeding.  Encouraged her to follow-up with dentist for further evaluation and management.  Discussed at length alarm symptoms that warrant emergent evaluation.  Strict return precautions given to which patient expressed understanding.   Final Clinical Impressions(s) / UC Diagnoses   Final diagnoses:  Dental infection  Pain, dental  Left ear pain     Discharge Instructions      Take antibiotics twice daily for 7 days to treat infection.  Alternate Tylenol and ibuprofen as we discussed.  Antibiotics can decrease the effectiveness of birth control so please make sure you are using backup birth control while on antibiotics.  If you have any worsening symptoms including swelling of your throat/tongue, shortness of breath, muffled voice you need to go to the emergency room as discussed.  Follow-up with dentist for further evaluation.     ED Prescriptions     Medication Sig Dispense Auth. Provider   amoxicillin-clavulanate (AUGMENTIN) 875-125 MG tablet Take 1 tablet by mouth every 12 (twelve) hours. 14 tablet Jacoya Bauman, Noberto Retort, PA-C      PDMP not reviewed this encounter.   Jeani Hawking, PA-C 11/25/20 [phone]

## 2020-11-25 NOTE — ED Triage Notes (Signed)
PT reports left sided toothache and ear pain for 2-3 days.

## 2020-11-25 NOTE — Discharge Instructions (Signed)
Take antibiotics twice daily for 7 days to treat infection.  Alternate Tylenol and ibuprofen as we discussed.  Antibiotics can decrease the effectiveness of birth control so please make sure you are using backup birth control while on antibiotics.  If you have any worsening symptoms including swelling of your throat/tongue, shortness of breath, muffled voice you need to go to the emergency room as discussed.  Follow-up with dentist for further evaluation.

## 2021-08-05 ENCOUNTER — Other Ambulatory Visit: Payer: Self-pay

## 2021-08-05 ENCOUNTER — Encounter (HOSPITAL_COMMUNITY): Payer: Self-pay

## 2021-08-05 ENCOUNTER — Ambulatory Visit (HOSPITAL_COMMUNITY)
Admission: EM | Admit: 2021-08-05 | Discharge: 2021-08-05 | Disposition: A | Discharge status: Home / Self Care | Payer: Medicaid Other | Attending: Family Medicine | Admitting: Family Medicine

## 2021-08-05 DIAGNOSIS — R1110 Vomiting, unspecified: Secondary | ICD-10-CM | POA: Diagnosis not present

## 2021-08-05 DIAGNOSIS — N926 Irregular menstruation, unspecified: Secondary | ICD-10-CM | POA: Insufficient documentation

## 2021-08-05 LAB — POCT URINALYSIS DIPSTICK, ED / UC
Bilirubin Urine: NEGATIVE
Glucose, UA: NEGATIVE mg/dL
Hgb urine dipstick: NEGATIVE
Leukocytes,Ua: NEGATIVE
Nitrite: NEGATIVE
Protein, ur: NEGATIVE mg/dL
Specific Gravity, Urine: 1.02 (ref 1.005–1.030)
Urobilinogen, UA: 0.2 mg/dL (ref 0.0–1.0)
pH: 7.5 (ref 5.0–8.0)

## 2021-08-05 LAB — HCG, QUANTITATIVE, PREGNANCY: hCG, Beta Chain, Quant, S: <1 m[IU]/mL (ref <5)

## 2021-08-05 LAB — POC URINE PREG, ED: Preg Test, Ur: NEGATIVE

## 2021-08-05 NOTE — Discharge Instructions (Signed)
You have had labs (blood pregnancy test) sent today. We will call you with any significant abnormalities or if there is need to begin or change treatment or pursue further follow up.  You may also review your test results online through MyChart. If you do not have a MyChart account, instructions to sign up should be on your discharge paperwork.  

## 2021-08-05 NOTE — ED Triage Notes (Signed)
Pt reports stomach pain and vomiting since last week.  ?

## 2021-08-05 NOTE — ED Notes (Signed)
Waiting for translator equipment.  Currently in use ?

## 2021-08-07 NOTE — ED Provider Notes (Signed)
?[facility] ? ? ?[id number] ?08/05/21 Arrival Time: [id number] ? ?ASSESSMENT & PLAN: ? ?1. Menstrual period late   ?2. Vomiting, unspecified vomiting type, unspecified whether nausea present   ? ?UPT negative. She is concerned about possibly being pregnant.  ?Serum HCG negative. ? ?Benign abdominal exam. No indications for urgent abdominal/pelvic imaging at this time. Discussed. ? ? ? ?Discharge Instructions   ? ?  ?You have had labs (blood pregnancy test) sent today. We will call you with any significant abnormalities or if there is need to begin or change treatment or pursue further follow up. ? ?You may also review your test results online through [facility]. If you do not have a MyChart account, instructions to sign up should be on your discharge paperwork. ? ? ? ? ? ?Reviewed expectations re: course of current medical issues. Questions answered. ?Outlined signs and symptoms indicating need for more acute intervention. ?Patient verbalized understanding. ?After Visit Summary given. ? ? ?SUBJECTIVE: ?History from: patient. ?Terri Miller is a 31 y.o. female who reports late menstrual period. Worried she is pregnant. Occas abd cramping. Afebrile. Otherwise well. ? ?Normal PO intake without n/v/d. ? ?Patient's last menstrual period was 05/13/2021 (exact date). ?Past Surgical History:  ?Procedure Laterality Date  ? NO PAST SURGERIES    ? ? ? ?OBJECTIVE: ? ?Vitals:  ? 08/05/21 1713  ?BP: 114/80  ?Pulse: 68  ?Resp: 16  ?Temp: 98.5 ?F (36.9 ?C)  ?TempSrc: Oral  ?SpO2: 98%  ?  ?General appearance: alert, oriented, no acute distress ?HEENT: [place]; AT; oropharynx moist ?Lungs: unlabored respirations ?Abdomen: soft; without distention; no specific tenderness to palpation; normal bowel sounds; without masses or organomegaly; without guarding or rebound tenderness ?Back: without reported CVA tenderness; FROM at waist ?Extremities: without LE edema; symmetrical; without gross deformities ?Skin: warm and  dry ?Neurologic: normal gait ?Psychological: alert and cooperative; normal mood and affect ? ?Labs: ?Results for orders placed or performed during the hospital encounter of 08/05/21  ?hCG, quantitative, pregnancy  ?Result Value Ref Range  ? hCG, Beta Chain, Quant, S <1 <5 mIU/mL  ?POC urine pregnancy  ?Result Value Ref Range  ? Preg Test, Ur NEGATIVE NEGATIVE  ?POC Urinalysis dipstick  ?Result Value Ref Range  ? Glucose, UA NEGATIVE NEGATIVE mg/dL  ? Bilirubin Urine NEGATIVE NEGATIVE  ? Ketones, ur TRACE (A) NEGATIVE mg/dL  ? Specific Gravity, Urine 1.020 1.005 - 1.030  ? Hgb urine dipstick NEGATIVE NEGATIVE  ? pH 7.5 5.0 - 8.0  ? Protein, ur NEGATIVE NEGATIVE mg/dL  ? Urobilinogen, UA 0.2 0.0 - 1.0 mg/dL  ? Nitrite NEGATIVE NEGATIVE  ? Leukocytes,Ua NEGATIVE NEGATIVE  ? ?Labs Reviewed  ?POCT URINALYSIS DIPSTICK, ED / UC - Abnormal; Notable for the following components:  ?    Result Value  ? Ketones, ur TRACE (*)   ? All other components within normal limits  ?HCG, QUANTITATIVE, PREGNANCY  ?POC URINE PREG, ED  ? ? ?Imaging: ?No results found.  ? ?No Known Allergies ?                                            ?Past Medical History:  ?Diagnosis Date  ? Medical history non-contributory   ? ? ?Social History  ? ?Socioeconomic History  ? Marital status: Married  ?  Spouse name: Ousseini  ? Number of children: Not on file  ?  Years of education: Not on file  ? Highest education level: Not on file  ?Occupational History  ? Not on file  ?Tobacco Use  ? Smoking status: Never  ? Smokeless tobacco: Never  ?Substance and Sexual Activity  ? Alcohol use: Yes  ? Drug use: Not Currently  ? Sexual activity: Yes  ?  Birth control/protection: Implant, None  ?Other Topics Concern  ? Not on file  ?Social History Narrative  ? ** Merged History Encounter **  ?    ? ?Social Determinants of Health  ? ?Financial Resource Strain: Not on file  ?Food Insecurity: No Food Insecurity  ? Worried About Charity fundraiser in the Last Year: Never  true  ? Ran Out of Food in the Last Year: Never true  ?Transportation Needs: No Transportation Needs  ? Lack of Transportation (Medical): No  ? Lack of Transportation (Non-Medical): No  ?Physical Activity: Not on file  ?Stress: Not on file  ?Social Connections: Not on file  ?Intimate Partner Violence: Not on file  ? ? ?Family History  ?Problem Relation Age of Onset  ? Healthy Mother   ? Healthy Father   ? ?  ?Vanessa Kick, MD ?08/07/21 [phone] ? ?

## 2021-08-22 ENCOUNTER — Ambulatory Visit (INDEPENDENT_AMBULATORY_CARE_PROVIDER_SITE_OTHER): Payer: Medicaid Other

## 2021-08-22 ENCOUNTER — Other Ambulatory Visit: Payer: Self-pay

## 2021-08-22 VITALS — BP 111/77 | HR 84 | Wt 169.6 lb

## 2021-08-22 DIAGNOSIS — Z3202 Encounter for pregnancy test, result negative: Secondary | ICD-10-CM | POA: Diagnosis not present

## 2021-08-22 DIAGNOSIS — Z3200 Encounter for pregnancy test, result unknown: Secondary | ICD-10-CM

## 2021-08-22 LAB — POCT PREGNANCY, URINE: Preg Test, Ur: NEGATIVE

## 2021-08-22 NOTE — Progress Notes (Signed)
Patient here today for a pregnancy test. Urine pregnancy test negative. Per patient she had three positive pregnancy tests at home in the month of February. Per patient her LMP was 06/13/21. Patient denies any vaginal bleeding. Patient states she has been experiencing some abdominal cramping. Reviewed with Dr. Para March. Per Dr. Para March it is likely patient has had a failed pregnancy. I reviewed this with the patient. I instructed patient to notify us if she does not have a menstrual period over the next month. Patient verbalized understanding. Per Dr. Para March the patient should have a beta hCG quant level drawn. I reviewed this with the patient. I verified the patient's phone number with her and explained we will call her with the blood work results. The patient denies any other questions or concerns.  ? ?Terri Richards, RN ?08/22/21 ?

## 2021-08-23 LAB — BETA HCG QUANT (REF LAB): hCG Quant: <1 m[IU]/mL

## 2021-08-25 ENCOUNTER — Telehealth: Payer: Self-pay | Admitting: *Deleted

## 2021-08-25 NOTE — Telephone Encounter (Signed)
-----   Message from Milas Hock, MD sent at 08/23/2021  7:10 AM EDT ----- ?Please let her know her pregnancy hormone by blood is negative. If no cycle by 4 weeks from now, she should come in for evaluation. This is a beta from a nurse visit.  ?Thanks, pad ?

## 2021-08-25 NOTE — Telephone Encounter (Signed)
I called patient with [employer] # [phone] and informed her of result of bhcg negative and recommendation per Dr. Para March. She voices understanding but states she would like to make appointment because it has been 3 months and she thinks she needs an ultrasound. I explained I cannot make her an appointment to see a provider but I will have registar call her next week since we are closed now. I explained her appointment may not be next week. I also explained she would need to see the provider to determine if she needs an ultrasound. She voices understanding. ?Terri Miller ?

## 2021-08-29 ENCOUNTER — Telehealth: Payer: Self-pay | Admitting: Family Medicine

## 2021-08-29 NOTE — Telephone Encounter (Signed)
Called patient using an interpreter, the patient answered the call and once she was informed that we are scheduled out until June she stated she wants to call around and find another location to be seen sooner. I told the patient to call us back if she needs [place].  ?

## 2021-10-24 ENCOUNTER — Ambulatory Visit (INDEPENDENT_AMBULATORY_CARE_PROVIDER_SITE_OTHER): Payer: Medicaid Other

## 2021-10-24 DIAGNOSIS — Z3201 Encounter for pregnancy test, result positive: Secondary | ICD-10-CM

## 2021-10-24 DIAGNOSIS — Z3687 Encounter for antenatal screening for uncertain dates: Secondary | ICD-10-CM

## 2021-10-24 LAB — POCT PREGNANCY, URINE: Preg Test, Ur: POSITIVE — AB

## 2021-10-24 NOTE — Progress Notes (Signed)
Patient dropped off urine today for pregnancy test. Urine pregnancy test positive. I called patient with the help of [name] interpreter [place] [phone] to review these results with her. Per patient she had a positive pregnancy test at home on 10/21/21. Patient states she has been experiencing some light abdominal cramping. Patient denies any vaginal bleeding. Patient does not know her LMP. Dating ultrasound scheduled. Patient would like to receive prenatal care here at the [facility]. I explained to patient her new OB appointment can be scheduled after the dating ultrasound when we know how far along she is. I recommended patient begin taking prenatal vitamins. I reviewed bleeding and ectopic precautions with patient. Patient verbalized understanding and denies any other questions.   Alesia Richards, RN 10/24/21

## 2021-10-31 ENCOUNTER — Ambulatory Visit (INDEPENDENT_AMBULATORY_CARE_PROVIDER_SITE_OTHER): Payer: Medicaid Other

## 2021-10-31 ENCOUNTER — Other Ambulatory Visit: Payer: Self-pay | Admitting: Obstetrics & Gynecology

## 2021-10-31 ENCOUNTER — Other Ambulatory Visit: Payer: Self-pay | Admitting: Family Medicine

## 2021-10-31 ENCOUNTER — Ambulatory Visit (INDEPENDENT_AMBULATORY_CARE_PROVIDER_SITE_OTHER): Payer: Medicaid Other | Admitting: Student

## 2021-10-31 ENCOUNTER — Encounter: Payer: Self-pay | Admitting: Student

## 2021-10-31 VITALS — BP 112/71 | HR 83

## 2021-10-31 DIAGNOSIS — O3680X0 Pregnancy with inconclusive fetal viability, not applicable or unspecified: Secondary | ICD-10-CM

## 2021-10-31 DIAGNOSIS — Z3A01 Less than 8 weeks gestation of pregnancy: Secondary | ICD-10-CM

## 2021-10-31 DIAGNOSIS — Z3687 Encounter for antenatal screening for uncertain dates: Secondary | ICD-10-CM

## 2021-10-31 DIAGNOSIS — Z3491 Encounter for supervision of normal pregnancy, unspecified, first trimester: Secondary | ICD-10-CM

## 2021-10-31 MED ORDER — VITAFOL GUMMIES 3.33-0.333-34.8 MG PO CHEW: 1.0000 | CHEWABLE_TABLET | Freq: Every day | ORAL | 5 refills | Status: DC

## 2021-10-31 NOTE — Patient Instructions (Signed)
Purchase knee high compression socks to wear on your trip (can get at [place], [place], or uniform shop)

## 2021-10-31 NOTE — Progress Notes (Signed)
GYNECOLOGY OFFICE VISIT NOTE  History:   Terri Miller is a 31 y.o. [phone] here today for ultrasound results.  Ultrasound shows live IUP measuring [redacted]w[redacted]d with FHR 121 bpm.  Abdominal pain: No   Vaginal bleeding: No    OB History  Gravida Para Term Preterm AB Living  6 3 3  0 2 2  SAB IAB Ectopic Multiple Live Births  2 0 0   2    # Outcome Date GA Lbr Len/2nd Weight Sex Delivery Anes PTL Lv  6 Current           5 Term 07/07/18 [redacted]w[redacted]d 05:12 / 00:02 7 lb 12.7 oz (3.535 kg) F Vag-Spont Local  LIV  4 Term 12/12/16 [redacted]w[redacted]d 05:00 / 00:10 7 lb 10.6 oz (3.475 kg) F Vag-Spont None  LIV  3 SAB 07/06/15          2 Term 10/01/13    F Vag-Spont     1 SAB              Health Maintenance Due  Topic Date Due   COVID-19 Vaccine (1) Never done   Hepatitis C Screening  Never done   PAP-Cervical Cytology Screening  08/11/2021   PAP SMEAR-Modifier  08/11/2021    Past Medical History:  Diagnosis Date   Medical history non-contributory     Past Surgical History:  Procedure Laterality Date   NO PAST SURGERIES      The following portions of the patient's history were reviewed and updated as appropriate: allergies, current medications, past family history, past medical history, past social history, past surgical history and problem list.   Review of Systems:  Pertinent items noted in HPI and remainder of comprehensive ROS otherwise negative.  Physical Exam:  BP 112/71 (BP Location: Right Arm, Patient Position: Sitting, Cuff Size: Normal)   Pulse 83   LMP 06/13/2021 (Exact Date)  CONSTITUTIONAL: Well-developed, well-nourished female in no acute distress.  HEENT:  Normocephalic, atraumatic. External right and left ear normal. No scleral icterus.  NECK: Normal range of motion, supple, no masses noted on observation SKIN: No rash noted. Not diaphoretic. No erythema. No pallor. MUSCULOSKELETAL: Normal range of motion. No edema noted. NEUROLOGIC: Alert and oriented to person,  place, and time. Normal muscle tone coordination.  PSYCHIATRIC: Normal mood and affect. Normal behavior. Normal judgment and thought content. RESPIRATORY: Effort normal, no problems with respiration noted  Labs and Imaging No results found for this or any previous visit (from the past 168 hour(s)). No results found.    Assessment and Plan:  Viable Pregnancy - Z34.90  Reviewed results with patient that show a viable intrauterine pregnancy at 6 weeks of gestation. Recommended she contact providers to start prenatal care, and she was given a list of options in her AVS. Prescription sent for prenatal vitamins. Reviewed ED precautions including vaginal bleeding like a period or heavier, severe abdominal pain, and fever. All questions answered.   Please refer to After Visit Summary for other counseling recommendations.   Patient to follow up for new ob at [facility] in August when she returns from [place] Patient planning 2 month trip to [place] - leaving this weekend. Reviewed traveling while pregnant. Recommend compression socks to where during travel.   In person [place] interpreter used for this encounter  Total face-to-face time with patient: 10 minutes.  Over 50% of encounter was spent on counseling and coordination of care.   Jorje Guild, NP Center for [employer], [facility]

## 2021-11-09 ENCOUNTER — Telehealth: Payer: Self-pay | Admitting: Lactation Services

## 2021-11-09 NOTE — Telephone Encounter (Signed)
Received request to change PNV prescription to 3 per day as prescription recommends for gummy vitamins. Called [facility] and spoke with Shawna Orleans to clarify can take 3 per day as recommended for this vitamin.

## 2022-01-16 ENCOUNTER — Ambulatory Visit: Payer: Medicaid Other

## 2022-01-30 ENCOUNTER — Encounter: Payer: Self-pay | Admitting: Family Medicine

## 2022-01-30 ENCOUNTER — Ambulatory Visit (INDEPENDENT_AMBULATORY_CARE_PROVIDER_SITE_OTHER): Payer: Medicaid Other

## 2022-01-30 ENCOUNTER — Other Ambulatory Visit (HOSPITAL_COMMUNITY)
Admission: RE | Admit: 2022-01-30 | Discharge: 2022-01-30 | Disposition: A | Discharge status: Home / Self Care | Payer: Medicaid Other | Source: Ambulatory Visit | Attending: Family Medicine | Admitting: Family Medicine

## 2022-01-30 ENCOUNTER — Other Ambulatory Visit: Payer: Self-pay

## 2022-01-30 DIAGNOSIS — Z3482 Encounter for supervision of other normal pregnancy, second trimester: Secondary | ICD-10-CM | POA: Diagnosis not present

## 2022-01-30 DIAGNOSIS — Z3480 Encounter for supervision of other normal pregnancy, unspecified trimester: Secondary | ICD-10-CM | POA: Insufficient documentation

## 2022-01-30 DIAGNOSIS — Z31430 Encounter of female for testing for genetic disease carrier status for procreative management: Secondary | ICD-10-CM | POA: Diagnosis not present

## 2022-01-30 MED ORDER — BLOOD PRESSURE KIT DEVI: 1.0000 | 0 refills | Status: DC | PRN

## 2022-01-30 MED ORDER — GOJJI WEIGHT SCALE MISC: 1.0000 | 0 refills | Status: DC | PRN

## 2022-01-30 NOTE — Progress Notes (Signed)
New OB Intake  Patient came in person for her New OB Intake. [facility] interpreter assist with the visit.  I discussed the limitations, risks, security and privacy concerns of performing an evaluation and management service by telephone and the availability of in person appointments. I also discussed with the patient that there may be a patient responsible charge related to this service. The patient expressed understanding and agreed to proceed.  I explained I am completing New OB Intake today. We discussed her EDD of 06/22/2022 that is based on ultrasound. Pt is G3/P3. I reviewed her allergies, medications, Medical/Surgical/OB history, and appropriate screenings. I informed her of [facility] services. [facility] information placed in AVS. Based on history, this is a/an  pregnancy uncomplicated .   Patient Active Problem List   Diagnosis Date Noted   Supervision of other normal pregnancy, antepartum 01/30/2022   Language barrier 01/09/2018    Concerns addressed today  Delivery Plans Plans to deliver at [facility]. Patient given information for [facility] Healthy Baby website for more information about [facility]. Patient is not interested in water birth. Offered upcoming OB visit with CNM to discuss further.  MyChart/Babyscripts MyChart access verified. I explained pt will have some visits in office and some virtually. Babyscripts instructions given and order placed. Patient verifies receipt of registration text/e-mail. Account successfully created and app downloaded.  Blood Pressure Cuff/Weight Scale Blood pressure cuff ordered for patient to pick-up from [employer]. Explained after first prenatal appt pt will check weekly and document in Babyscripts. Patient does not  have weight scale. Weight scale ordered for patient to pick up from [employer].   Anatomy US Explained first scheduled [place] will be around 19 weeks. Anatomy US scheduled for 02/05/2022 at 2:45 PM. Pt notified to  arrive at 2:30 PM.  Labs Discussed Avelina Laine genetic screening with patient. Would like both Panorama and Horizon drawn at new OB visit. Routine prenatal labs needed.  Covid Vaccine Patient has not covid vaccine.   Is patient a CenteringPregnancy candidate?  Not a Candidate Not a candidate due to Language    Is patient a Mom+Baby Combined Care candidate?  Not a candidate     Social Determinants of Health Food Insecurity: Patient denies food insecurity. WIC Referral: Patient is interested in referral to [facility].  Transportation: Patient denies transportation needs. Childcare: Discussed no children allowed at ultrasound appointments. Offered childcare services; patient declines childcare services at this time.  First visit review I reviewed new OB appt with pt. I explained she will have a provider visit that includes pap smear and pelvic exam. Explained pt will be seen by Myrtie Hawk DO at first visit; encounter routed to appropriate provider. Explained that patient will be seen by pregnancy navigator following visit with provider.   Vidal Schwalbe, [place] 01/30/2022  3:53 PM

## 2022-01-31 LAB — GC/CHLAMYDIA PROBE AMP (~~LOC~~) NOT AT ARMC
Chlamydia: NEGATIVE
Comment: NEGATIVE
Comment: NORMAL
Neisseria Gonorrhea: NEGATIVE

## 2022-02-01 LAB — CBC/D/PLT+RPR+RH+ABO+RUBIGG...
Antibody Screen: NEGATIVE
Basophils Absolute: 0.1 10*3/uL (ref 0.0–0.2)
Basos: 1 %
EOS (ABSOLUTE): 0.1 10*3/uL (ref 0.0–0.4)
Eos: 1 %
HCV Ab: NONREACTIVE
HIV Screen 4th Generation wRfx: NONREACTIVE
Hematocrit: 39.2 % (ref 34.0–46.6)
Hemoglobin: 13.2 g/dL (ref 11.1–15.9)
Hepatitis B Surface Ag: NEGATIVE
Immature Grans (Abs): 0.2 10*3/uL — ABNORMAL HIGH (ref 0.0–0.1)
Immature Granulocytes: 2 %
Lymphocytes Absolute: 2.3 10*3/uL (ref 0.7–3.1)
Lymphs: 25 %
MCH: 29.7 pg (ref 26.6–33.0)
MCHC: 33.7 g/dL (ref 31.5–35.7)
MCV: 88 fL (ref 79–97)
Monocytes Absolute: 0.9 10*3/uL (ref 0.1–0.9)
Monocytes: 10 %
Neutrophils Absolute: 5.6 10*3/uL (ref 1.4–7.0)
Neutrophils: 61 %
Platelets: 320 10*3/uL (ref 150–450)
RBC: 4.44 x10E6/uL (ref 3.77–5.28)
RDW: 13 % (ref 11.7–15.4)
RPR Ser Ql: NONREACTIVE
Rh Factor: POSITIVE
Rubella Antibodies, IGG: 25 index (ref >0.99)
WBC: 9.1 10*3/uL (ref 3.4–10.8)

## 2022-02-01 LAB — AFP, SERUM, OPEN SPINA BIFIDA
AFP MoM: 1.18
AFP Value: 67 ng/mL
Gest. Age on Collection Date: 19.4 weeks
Maternal Age At EDD: 30 yr
OSBR Risk 1 IN: 10000
Test Results:: NEGATIVE
Weight: 160 [lb_av]

## 2022-02-01 LAB — URINE CULTURE, OB REFLEX

## 2022-02-01 LAB — HEMOGLOBIN A1C
Est. average glucose Bld gHb Est-mCnc: 97 mg/dL
Hgb A1c MFr Bld: 5 % (ref 4.8–5.6)

## 2022-02-01 LAB — CULTURE, OB URINE

## 2022-02-01 LAB — HCV INTERPRETATION

## 2022-02-05 ENCOUNTER — Ambulatory Visit: Payer: Medicaid Other | Attending: Family Medicine

## 2022-02-05 DIAGNOSIS — Z368A Encounter for antenatal screening for other genetic defects: Secondary | ICD-10-CM | POA: Insufficient documentation

## 2022-02-05 DIAGNOSIS — Z3480 Encounter for supervision of other normal pregnancy, unspecified trimester: Secondary | ICD-10-CM

## 2022-02-05 DIAGNOSIS — Z3A20 20 weeks gestation of pregnancy: Secondary | ICD-10-CM | POA: Insufficient documentation

## 2022-02-06 LAB — PANORAMA PRENATAL TEST FULL PANEL:PANORAMA TEST PLUS 5 ADDITIONAL MICRODELETIONS: FETAL FRACTION: 8.8

## 2022-02-06 LAB — HORIZON CUSTOM: REPORT SUMMARY: NEGATIVE

## 2022-02-26 ENCOUNTER — Ambulatory Visit (INDEPENDENT_AMBULATORY_CARE_PROVIDER_SITE_OTHER): Payer: Medicaid Other | Admitting: Family Medicine

## 2022-02-26 ENCOUNTER — Other Ambulatory Visit: Payer: Self-pay

## 2022-02-26 VITALS — BP 98/57 | HR 76 | Wt 157.0 lb

## 2022-02-26 DIAGNOSIS — Z789 Other specified health status: Secondary | ICD-10-CM

## 2022-02-26 DIAGNOSIS — Z3480 Encounter for supervision of other normal pregnancy, unspecified trimester: Secondary | ICD-10-CM

## 2022-02-26 NOTE — Progress Notes (Addendum)
PRENATAL VISIT NOTE  Subjective:  Terri Miller is a 31 y.o. [phone] at [redacted]w[redacted]d being seen today for her first prenatal visit for this pregnancy.  She is currently monitored for the following issues for this low-risk pregnancy and has Language barrier and Supervision of other normal pregnancy, antepartum on their problem list.  Patient reports pelvic pressure  . Vag. Bleeding: None.  Movement: Present. Denies leaking of fluid.   She is planning to  both  breast and bottle. Desires nexplanon for contraception.   The following portions of the patient's history were reviewed and updated as appropriate: allergies, current medications, past family history, past medical history, past social history, past surgical history and problem list.   Objective:   Vitals:   02/26/22 0944  BP: (!) 98/57  Pulse: 76  Weight: 157 lb (71.2 kg)    Fetal Status: Fetal Heart Rate (bpm): 159   Movement: Present     General:  Alert, oriented and cooperative. Patient is in no acute distress.  Skin: Skin is warm and dry. No rash noted.   Cardiovascular: Normal heart rate and rhythm noted  Respiratory: Normal respiratory effort, no problems with respiration noted. Clear to auscultation.   Abdomen: Soft, gravid, appropriate for gestational age. Normal bowel sounds. Non-tender. Pain/Pressure: Present     Pelvic: Cervical exam deferred       Normal cervical contour, no lesions, no bleeding following pap, normal discharge  Extremities: Normal range of motion.  Edema: None  Mental Status: Normal mood and affect. Normal behavior. Normal judgment and thought content.    Indications for ASA therapy (per uptodate) One of the following: Previous pregnancy with preeclampsia, especially early onset and with an adverse outcome No Multifetal gestation No Chronic hypertension No Type 1 or 2 diabetes mellitus No Chronic kidney disease No Autoimmune disease (antiphospholipid syndrome, systemic lupus  erythematosus) No  Two or more of the following: Nulliparity No Obesity (body mass index >30 kg/m2) No Family history of preeclampsia in mother or sister No Age ?35 years No Sociodemographic characteristics (African American race, low socioeconomic level) No Personal risk factors (eg, previous pregnancy with low birth weight or small for gestational age infant, previous adverse pregnancy outcome [eg, stillbirth], interval >10 years between pregnancies) No  Indications for early GDM screening  First-degree relative with diabetes No BMI >30kg/m2 No Age > 35 No Previous birth of an infant weighing ?4000 g No Gestational diabetes mellitus in a previous pregnancy No Glycated hemoglobin ?5.7 percent (39 mmol/mol), impaired glucose tolerance, or impaired fasting glucose on previous testing No High-risk race/ethnicity (eg, African American, Latino, Native American, [place] American, Pacific Islander) No Previous stillbirth of unknown cause No Maternal birthweight > 9 lbs No History of cardiovascular disease No Hypertension or on therapy for hypertension No High-density lipoprotein cholesterol level <35 mg/dL (0.90 mmol/L) and/or a triglyceride level >250 mg/dL (2.82 mmol/L) No Polycystic ovary syndrome No Physical inactivity No Other clinical condition associated with insulin resistance (eg, severe obesity, acanthosis nigricans) No Current use of glucocorticoids No   Early screening tests: FBS, A1C, Random CBG, glucose challenge  Assessment and Plan:  Pregnancy: [id number] at [redacted]w[redacted]d  1. Supervision of other normal pregnancy, antepartum  2. Language barrier    Nursing Staff Provider  Office Location  [facility] Dating  6wk [place]  [facility] Model Valu.Nieves ] Traditional [ ]  Centering [ ]  Mom-Baby Dyad    Language  [place]  Anatomy US  nml  Flu Vaccine   Genetic/Carrier Screen  NIPS:   LR female AFP:   NEG Horizon: NEG  TDaP Vaccine    Hgb A1C or  GTT Early  Third trimester   COVID Vaccine    LAB RESULTS    Rhogam   Blood Type O/Positive/-- (09/05 1619) O POS  Baby Feeding Plan Bottle  Antibody Negative (09/05 1619) NEG  Contraception Nexplanon  Rubella 25.00 (09/05 1619)IMM  Circumcision Yes  RPR Non Reactive (09/05 1619) [facility]  HBsAg Negative (09/05 1619) NEG  Support Person FOB HCVAb  NEG  Prenatal Classes  HIV Non Reactive (09/05 1619)   NE  BTL Consent  GBS   (For PCN allergy, check sensitivities)   VBAC Consent  Pap  pp       DME Rx [ ]  BP cuff [ ]  Weight Scale Waterbirth  [ ]  Class [ ]  Consent [ ]  CNM visit  PHQ9 & GAD7 [  ] new OB [  ] 28 weeks  [  ] 36 weeks Induction  [ ]  Orders Entered [ ] Foley Y/N   Preterm labor/first trimester warning symptoms and general obstetric precautions including but not limited to vaginal bleeding, contractions, leaking of fluid and fetal movement were reviewed in detail with the patient. Please refer to After Visit Summary for other counseling recommendations.   Discussed the normal visit cadence for prenatal care Discussed the nature of our practice with multiple providers including residents and students   Return in about 4 weeks (around 03/26/2022) for [place].  No future appointments.  Shelda Pal, [place] Fellow, Faculty practice [place] for [place] 02/26/22  10:19 AM

## 2022-03-26 ENCOUNTER — Other Ambulatory Visit: Payer: Medicaid Other

## 2022-03-26 ENCOUNTER — Other Ambulatory Visit: Payer: Self-pay | Admitting: General Practice

## 2022-03-26 ENCOUNTER — Other Ambulatory Visit: Payer: Self-pay

## 2022-03-26 ENCOUNTER — Ambulatory Visit (INDEPENDENT_AMBULATORY_CARE_PROVIDER_SITE_OTHER): Payer: Medicaid Other | Admitting: Student

## 2022-03-26 VITALS — BP 112/59 | HR 78 | Wt 157.4 lb

## 2022-03-26 DIAGNOSIS — Z3A27 27 weeks gestation of pregnancy: Secondary | ICD-10-CM

## 2022-03-26 DIAGNOSIS — Z3482 Encounter for supervision of other normal pregnancy, second trimester: Secondary | ICD-10-CM

## 2022-03-26 DIAGNOSIS — Z3480 Encounter for supervision of other normal pregnancy, unspecified trimester: Secondary | ICD-10-CM

## 2022-03-26 DIAGNOSIS — Z23 Encounter for immunization: Secondary | ICD-10-CM | POA: Diagnosis not present

## 2022-03-26 DIAGNOSIS — O26892 Other specified pregnancy related conditions, second trimester: Secondary | ICD-10-CM

## 2022-03-26 DIAGNOSIS — N898 Other specified noninflammatory disorders of vagina: Secondary | ICD-10-CM

## 2022-03-26 DIAGNOSIS — Z603 Acculturation difficulty: Secondary | ICD-10-CM

## 2022-03-26 DIAGNOSIS — Z789 Other specified health status: Secondary | ICD-10-CM

## 2022-03-26 NOTE — Addendum Note (Signed)
Addended by: Michel Harrow on: 03/26/2022 11:19 AM   Modules accepted: Orders

## 2022-03-26 NOTE — Progress Notes (Signed)
   PRENATAL VISIT NOTE  Subjective:  Terri Miller is a 31 y.o. [phone] at [redacted]w[redacted]d being seen today for ongoing prenatal care.  She is currently monitored for the following issues for this low-risk pregnancy and has Language barrier and Supervision of other normal pregnancy, antepartum on their problem list.  Patient reports  vaginal discharge . Reports intermittent white discharge for the last several weeks. Discharge is thin & white. Denies odor, vaginal irritation, or vaginal bleeding. No leaking watery fluid.    Contractions: Not present. Vag. Bleeding: None.  Movement: Present. Denies leaking of fluid.   The following portions of the patient's history were reviewed and updated as appropriate: allergies, current medications, past family history, past medical history, past social history, past surgical history and problem list.   Objective:   Vitals:   03/26/22 0906  BP: (!) 112/59  Pulse: 78  Weight: 157 lb 6.4 oz (71.4 kg)    Fetal Status: Fetal Heart Rate (bpm): 146 Fundal Height: 26 cm Movement: Present     General:  Alert, oriented and cooperative. Patient is in no acute distress.  Skin: Skin is warm and dry. No rash noted.   Cardiovascular: Normal heart rate noted  Respiratory: Normal respiratory effort, no problems with respiration noted  Abdomen: Soft, gravid, appropriate for gestational age.  Pain/Pressure: Absent     Pelvic: Cervical exam deferred        Extremities: Normal range of motion.  Edema: None  Mental Status: Normal mood and affect. Normal behavior. Normal judgment and thought content.   Assessment and Plan:  1. Supervision of other normal pregnancy, antepartum -Doing well.  - CBC - HIV Antibody (routine testing w rflx) - RPR  2. Vaginal discharge during pregnancy in second trimester -Likely physiologic discharge. Reviewed s/s of infection. Patient doesn't think she needs any testing done today.  -Reviewed signs concerning for PPROM & when  to go to MAU  3. Language barrier -[place] interpreter present for this encounter  4. [redacted] weeks gestation of pregnancy -Patient to return in 3 weeks for ROB   Preterm labor symptoms and general obstetric precautions including but not limited to vaginal bleeding, contractions, leaking of fluid and fetal movement were reviewed in detail with the patient. Please refer to After Visit Summary for other counseling recommendations.   Return in about 3 weeks (around 04/16/2022) for Routine OB.  Future Appointments  Date Time Provider [place]  03/26/2022  9:55 AM Jorje Guild, NP [facility]  04/16/2022  8:35 AM Clement Husbands, Ernestine Conrad, DO [facility]    Jorje Guild, NP

## 2022-03-27 LAB — GLUCOSE TOLERANCE, 2 HOURS W/ 1HR
Glucose, 1 hour: 109 mg/dL (ref 70–179)
Glucose, 2 hour: 88 mg/dL (ref 70–152)
Glucose, Fasting: 71 mg/dL (ref 70–91)

## 2022-03-27 LAB — CBC
Hematocrit: 36.9 % (ref 34.0–46.6)
Hemoglobin: 12 g/dL (ref 11.1–15.9)
MCH: 28.2 pg (ref 26.6–33.0)
MCHC: 32.5 g/dL (ref 31.5–35.7)
MCV: 87 fL (ref 79–97)
Platelets: 276 10*3/uL (ref 150–450)
RBC: 4.25 x10E6/uL (ref 3.77–5.28)
RDW: 12.8 % (ref 11.7–15.4)
WBC: 8.3 10*3/uL (ref 3.4–10.8)

## 2022-03-27 LAB — RPR: RPR Ser Ql: NONREACTIVE

## 2022-03-27 LAB — HIV ANTIBODY (ROUTINE TESTING W REFLEX): HIV Screen 4th Generation wRfx: NONREACTIVE

## 2022-04-13 ENCOUNTER — Encounter (HOSPITAL_COMMUNITY): Payer: Self-pay | Admitting: Obstetrics and Gynecology

## 2022-04-13 ENCOUNTER — Inpatient Hospital Stay (HOSPITAL_BASED_OUTPATIENT_CLINIC_OR_DEPARTMENT_OTHER): Payer: Medicaid Other

## 2022-04-13 ENCOUNTER — Inpatient Hospital Stay (HOSPITAL_COMMUNITY)
Admission: AD | Admit: 2022-04-13 | Discharge: 2022-04-13 | Disposition: A | Discharge status: Home / Self Care | Payer: Medicaid Other | Attending: Obstetrics and Gynecology | Admitting: Obstetrics and Gynecology

## 2022-04-13 DIAGNOSIS — O4693 Antepartum hemorrhage, unspecified, third trimester: Secondary | ICD-10-CM

## 2022-04-13 DIAGNOSIS — N898 Other specified noninflammatory disorders of vagina: Secondary | ICD-10-CM

## 2022-04-13 DIAGNOSIS — Z3A30 30 weeks gestation of pregnancy: Secondary | ICD-10-CM | POA: Insufficient documentation

## 2022-04-13 DIAGNOSIS — O26853 Spotting complicating pregnancy, third trimester: Secondary | ICD-10-CM | POA: Diagnosis present

## 2022-04-13 DIAGNOSIS — O26893 Other specified pregnancy related conditions, third trimester: Secondary | ICD-10-CM

## 2022-04-13 DIAGNOSIS — Z711 Person with feared health complaint in whom no diagnosis is made: Secondary | ICD-10-CM

## 2022-04-13 LAB — WET PREP, GENITAL
Clue Cells Wet Prep HPF POC: NONE SEEN
Sperm: NONE SEEN
Trich, Wet Prep: NONE SEEN
WBC, Wet Prep HPF POC: <10 (ref <10)
Yeast Wet Prep HPF POC: NONE SEEN

## 2022-04-13 LAB — GC/CHLAMYDIA PROBE AMP (~~LOC~~) NOT AT ARMC
Chlamydia: NEGATIVE
Comment: NEGATIVE
Comment: NORMAL
Neisseria Gonorrhea: NEGATIVE

## 2022-04-13 LAB — URINALYSIS, ROUTINE W REFLEX MICROSCOPIC
Bilirubin Urine: NEGATIVE
Glucose, UA: NEGATIVE mg/dL
Hgb urine dipstick: NEGATIVE
Ketones, ur: NEGATIVE mg/dL
Leukocytes,Ua: NEGATIVE
Nitrite: NEGATIVE
Protein, ur: NEGATIVE mg/dL
Specific Gravity, Urine: 1.001 — ABNORMAL LOW (ref 1.005–1.030)
pH: 7 (ref 5.0–8.0)

## 2022-04-13 NOTE — MAU Note (Signed)
.  Terri Miller is a 31 y.o. at [redacted]w[redacted]d here in MAU reporting scant amt vaginal bleeding when she went to the BR about an hour ago. . Mild lower abd pain. Pt reports good FM.   Onset of complaint: 1hr Pain score: 2 Vitals:   04/13/22 0049 04/13/22 0050  BP:  99/64  Pulse: 80   Resp: 18   Temp: 98.5 F (36.9 C)   SpO2: 100%      FHT:134 Lab orders placed from triage:  u/a

## 2022-04-13 NOTE — MAU Provider Note (Signed)
History     CSN: [id number]  Arrival date and time: 04/13/22 0375   Event Date/Time   First Provider Initiated Contact with Patient 04/13/22 0124      Chief Complaint  Patient presents with   Contractions   Vaginal Bleeding   HPI  [name] is a 31 y.o. female [phone] @ 73w0dhere with spotting she noticed when she wiped after urinating. She first noticed this today at 0000. No recent intercourse. She denies pain. + fetal movement. Does not notice blood in her underwear. This is a new problem, she has never had bleeding in the past.   OB History     Gravida  6   Para  3   Term  3   Preterm  0   AB  2   Living  3      SAB  2   IAB  0   Ectopic  0   Multiple      Live Births  3           Past Medical History:  Diagnosis Date   Medical history non-contributory     Past Surgical History:  Procedure Laterality Date   NO PAST SURGERIES      Family History  Problem Relation Age of Onset   Healthy Mother    Healthy Father     Social History   Tobacco Use   Smoking status: Never   Smokeless tobacco: Never  Substance Use Topics   Alcohol use: Yes   Drug use: Not Currently    Allergies: No Known Allergies  Medications Prior to Admission  Medication Sig Dispense Refill Last Dose   Prenatal Vit-Fe Phos-FA-Omega (VITAFOL GUMMIES) 3.33-0.333-34.8 MG CHEW Chew 1 tablet by mouth daily. 90 tablet 5 Past Week   Blood Pressure Monitoring (BLOOD PRESSURE KIT) DEVI 1 Device by Does not apply route as needed. (Patient not taking: Reported on 03/26/2022) 1 each 0    Misc. Devices (GOJJI WEIGHT SCALE) MISC 1 Device by Does not apply route as needed. (Patient not taking: Reported on 03/26/2022) 1 each 0    No results found for this or any previous visit (from the past 48 hour(s)).   Review of Systems  Constitutional:  Negative for fever.  Gastrointestinal:  Negative for abdominal pain, anal bleeding, blood in stool and  constipation.  Genitourinary:  Positive for vaginal bleeding. Negative for vaginal discharge.   Physical Exam   Blood pressure (!) 101/56, pulse 80, temperature 98.5 F (36.9 C), resp. rate 18, height _0  (1.651 m), weight 69.4 kg, last menstrual period 06/13/2021, SpO2 100 %, currently breastfeeding.  Physical Exam Constitutional:      General: She is not in acute distress.    Appearance: Normal appearance. She is not ill-appearing, toxic-appearing or diaphoretic.  HENT:     Head: Normocephalic.  Genitourinary:    General: Normal vulva.     Comments: Vagina - Small amount of white vaginal discharge, no odor  Cervix - No contact bleeding, no active bleeding  Bimanual exam: Cervix closed GC/Chlam, wet prep done Chaperone present for exam.   No hemorrhoids noted  Musculoskeletal:        General: Normal range of motion.  Skin:    General: Skin is warm.  Neurological:     Mental Status: She is alert and oriented to person, place, and time.  Psychiatric:        Behavior: Behavior normal.   Fetal Tracing: Baseline: 140 bpm  Variability: moderate  Accelerations: 15x15 Decelerations: None Toco: None  MAU Course  Procedures  MDM  Limited US without acute findings.  Wet prep and GC collected  Assessment and Plan   A:  1. Vaginal discharge in pregnancy in third trimester   2. Worried well   3. [redacted] weeks gestation of pregnancy     P:  Dc home Return to MAU if symptoms worsen Kick counts reviewed.    Noni Saupe I, NP 04/13/2022 2:29 AM

## 2022-04-15 NOTE — Progress Notes (Unsigned)
   PRENATAL VISIT NOTE  Subjective:  Terri Miller is a 31 y.o. [phone] at [redacted]w[redacted]d being seen today for ongoing prenatal care.  She is currently monitored for the following issues for this {Blank single:19197::"high-risk","low-risk"} pregnancy and has Language barrier and Supervision of other normal pregnancy, antepartum on their problem list.  Patient reports {sx:[id number]}. ***   .  .   . Denies leaking of fluid.   The following portions of the patient's history were reviewed and updated as appropriate: allergies, current medications, past family history, past medical history, past social history, past surgical history and problem list.   Objective:  There were no vitals filed for this visit.  Fetal Status:           General:  Alert, oriented and cooperative. Patient is in no acute distress.  Skin: Skin is warm and dry. No rash noted.   Cardiovascular: Normal heart rate noted  Respiratory: Normal respiratory effort, no problems with respiration noted  Abdomen: Soft, gravid, appropriate for gestational age.        Pelvic: {Blank single:19197::"Cervical exam performed in the presence of a chaperone","Cervical exam deferred"}        Extremities: Normal range of motion.     Mental Status: Normal mood and affect. Normal behavior. Normal judgment and thought content.   Assessment and Plan:  Pregnancy: [id number] at [redacted]w[redacted]d  1. Supervision of other normal pregnancy, antepartum  2. Language barrier    {Blank single:19197::"Term","Preterm"} labor symptoms and general obstetric precautions including but not limited to vaginal bleeding, contractions, leaking of fluid and fetal movement were reviewed in detail with the patient. Please refer to After Visit Summary for other counseling recommendations.   No follow-ups on file.  Future Appointments  Date Time Provider Department Center  04/16/2022  8:35 AM Mercado-Ortiz, Lahoma Crocker, DO [facility]    Myrtie Hawk, DO FMO[facility]  Fellow, Faculty practice [facility], Center for [facility] 04/15/22  8:20 PM

## 2022-04-16 ENCOUNTER — Other Ambulatory Visit: Payer: Self-pay

## 2022-04-16 ENCOUNTER — Ambulatory Visit (INDEPENDENT_AMBULATORY_CARE_PROVIDER_SITE_OTHER): Payer: Medicaid Other | Admitting: Family Medicine

## 2022-04-16 VITALS — BP 112/65 | HR 91 | Wt 155.0 lb

## 2022-04-16 DIAGNOSIS — Z789 Other specified health status: Secondary | ICD-10-CM

## 2022-04-16 DIAGNOSIS — Z603 Acculturation difficulty: Secondary | ICD-10-CM

## 2022-04-16 DIAGNOSIS — Z3480 Encounter for supervision of other normal pregnancy, unspecified trimester: Secondary | ICD-10-CM

## 2022-04-16 DIAGNOSIS — Z3A30 30 weeks gestation of pregnancy: Secondary | ICD-10-CM

## 2022-04-16 DIAGNOSIS — Z3483 Encounter for supervision of other normal pregnancy, third trimester: Secondary | ICD-10-CM

## 2022-04-30 ENCOUNTER — Ambulatory Visit (INDEPENDENT_AMBULATORY_CARE_PROVIDER_SITE_OTHER): Payer: Medicaid Other | Admitting: Advanced Practice Midwife

## 2022-04-30 VITALS — BP 116/68 | HR 96 | Wt 154.1 lb

## 2022-04-30 DIAGNOSIS — Z3A32 32 weeks gestation of pregnancy: Secondary | ICD-10-CM

## 2022-04-30 DIAGNOSIS — Z3483 Encounter for supervision of other normal pregnancy, third trimester: Secondary | ICD-10-CM

## 2022-04-30 DIAGNOSIS — Z3480 Encounter for supervision of other normal pregnancy, unspecified trimester: Secondary | ICD-10-CM

## 2022-04-30 DIAGNOSIS — Z789 Other specified health status: Secondary | ICD-10-CM

## 2022-04-30 NOTE — Progress Notes (Signed)
   PRENATAL VISIT NOTE  Subjective:  Terri Miller is a 31 y.o. [phone] at [redacted]w[redacted]d being seen today for ongoing prenatal care.  She is currently monitored for the following issues for this low-risk pregnancy and has Language barrier and Supervision of other normal pregnancy, antepartum on their problem list.  Patient reports no complaints.  Contractions: Irritability.  .  Movement: Present. Denies leaking of fluid.   The following portions of the patient's history were reviewed and updated as appropriate: allergies, current medications, past family history, past medical history, past social history, past surgical history and problem list.   Objective:   Vitals:   04/30/22 1423  BP: 116/68  Pulse: 96  Weight: 154 lb 1.6 oz (69.9 kg)    Fetal Status: Fetal Heart Rate (bpm): 154 Fundal Height: 32 cm Movement: Present     General:  Alert, oriented and cooperative. Patient is in no acute distress.  Skin: Skin is warm and dry. No rash noted.   Cardiovascular: Normal heart rate noted  Respiratory: Normal respiratory effort, no problems with respiration noted  Abdomen: Soft, gravid, appropriate for gestational age.  Pain/Pressure: Absent     Pelvic: Cervical exam deferred        Extremities: Normal range of motion.     Mental Status: Normal mood and affect. Normal behavior. Normal judgment and thought content.   Assessment and Plan:  Pregnancy: [id number] at [redacted]w[redacted]d 1. Supervision of other normal pregnancy, antepartum --Anticipatory guidance about next visits/weeks of pregnancy given.   2. Language barrier --Live [name] interpreter used for all communication  3. [redacted] weeks gestation of pregnancy   Preterm labor symptoms and general obstetric precautions including but not limited to vaginal bleeding, contractions, leaking of fluid and fetal movement were reviewed in detail with the patient. Please refer to After Visit Summary for other counseling recommendations.   Return in  about 2 weeks (around 05/14/2022) for Any provider, LOB, Female provider preferred.  Future Appointments  Date Time Provider Department Center  05/14/2022  9:35 AM Dorathy Kinsman, [facility]  05/29/2022  2:35 PM Alfredia Ferguson, MD [facility]  06/05/2022 10:15 AM Milas Hock, MD [facility]  06/12/2022 10:15 AM Armando Reichert, CNM [facility]  06/19/2022 10:15 AM Bernerd Limbo, CNM [facility]  06/27/2022  1:15 PM [facility]  06/27/2022  2:35 PM Bernerd Limbo, CNM [facility]    Sharen Counter, CNM

## 2022-05-14 ENCOUNTER — Ambulatory Visit (INDEPENDENT_AMBULATORY_CARE_PROVIDER_SITE_OTHER): Payer: Medicaid Other | Admitting: Advanced Practice Midwife

## 2022-05-14 ENCOUNTER — Encounter: Payer: Self-pay | Admitting: *Deleted

## 2022-05-14 ENCOUNTER — Encounter: Payer: Self-pay | Admitting: Advanced Practice Midwife

## 2022-05-14 VITALS — BP 102/65 | HR 108 | Temp 98.6°F | Wt 150.8 lb

## 2022-05-14 DIAGNOSIS — R051 Acute cough: Secondary | ICD-10-CM

## 2022-05-14 DIAGNOSIS — Z789 Other specified health status: Secondary | ICD-10-CM

## 2022-05-14 DIAGNOSIS — Z3480 Encounter for supervision of other normal pregnancy, unspecified trimester: Secondary | ICD-10-CM

## 2022-05-14 DIAGNOSIS — O2613 Low weight gain in pregnancy, third trimester: Secondary | ICD-10-CM | POA: Insufficient documentation

## 2022-05-14 DIAGNOSIS — J069 Acute upper respiratory infection, unspecified: Secondary | ICD-10-CM

## 2022-05-14 DIAGNOSIS — Z3A34 34 weeks gestation of pregnancy: Secondary | ICD-10-CM

## 2022-05-14 MED ORDER — HYDROCOD POLI-CHLORPHE POLI ER 10-8 MG/5ML PO SUER: 5.0000 mL | Freq: Two times a day (BID) | ORAL | 0 refills | Status: DC | PRN

## 2022-05-14 NOTE — Progress Notes (Signed)
   PRENATAL VISIT NOTE  Subjective:  Terri Miller is a 31 y.o. [phone] at [redacted]w[redacted]d being seen today for ongoing prenatal care.  She is currently monitored for the following issues for this low-risk pregnancy and has Language barrier and Supervision of other normal pregnancy, antepartum on their problem list.  Patient reports  Subjective fever (hasn't checked), cough, congestion, body aches x 2 days. Hasn't tried any meds or taken any test . No sick contacts. Cough is severely interfering with sleep. Denies SOB.  Contractions: Irritability. Vag. Bleeding: None.  Movement: Present. Denies leaking of fluid.   The following portions of the patient's history were reviewed and updated as appropriate: allergies, current medications, past family history, past medical history, past social history, past surgical history and problem list.   Objective:   Vitals:   05/14/22 0931 05/14/22 2123  BP: 102/65   Pulse: (!) 121 (!) 108  Temp: 98.6 F (37 C)   Weight: 150 lb 12.8 oz (68.4 kg)     Fetal Status: Fetal Heart Rate (bpm): 140 Fundal Height: 34 cm Movement: Present  Presentation: Vertex  General:  Alert, oriented and cooperative. Patient is in no acute distress.  Skin: Skin is warm and dry. No rash noted.   Cardiovascular: Normal heart rate noted  Respiratory: Normal respiratory effort, no problems with respiration noted  Abdomen: Soft, gravid, appropriate for gestational age.  Pain/Pressure: Absent     Pelvic: Cervical exam deferred        Extremities: Normal range of motion.  Edema: None  Mental Status: Normal mood and affect. Normal behavior. Normal judgment and thought content.   Assessment and Plan:  Pregnancy: [id number] at [redacted]w[redacted]d 1. Supervision of other normal pregnancy, antepartum - GBS NV  2. Language barrier - [name] interpreter used  3. [redacted] weeks gestation of pregnancy  4. Viral upper respiratory tract infection - No fever today. Hasn't taken antipyretics.   -  Recommend Pt go to Urgent Care for rapid Flu and Covid testing. - URI med list given - chlorpheniramine-HYDROcodone (TUSSIONEX) 10-8 MG/5ML; Take 5 mLs by mouth every 12 (twelve) hours as needed for cough.  Dispense: 150 mL; Refill: 0  5. Acute cough  - chlorpheniramine-HYDROcodone (TUSSIONEX) 10-8 MG/5ML; Take 5 mLs by mouth every 12 (twelve) hours as needed for cough.  Dispense: 150 mL; Refill: 0  Preterm labor symptoms and general obstetric precautions including but not limited to vaginal bleeding, contractions, leaking of fluid and fetal movement were reviewed in detail with the patient. Please refer to After Visit Summary for other counseling recommendations.   No follow-ups on file.  Future Appointments  Date Time Provider Department Center  05/29/2022  2:35 PM Alfredia Ferguson, MD [facility]  06/05/2022 10:15 AM Milas Hock, MD [facility]  06/12/2022 10:15 AM Armando Reichert, CNM [facility]  06/19/2022 10:15 AM Bernerd Limbo, CNM [facility]  06/27/2022  1:15 PM [facility]  06/27/2022  2:35 PM Bernerd Limbo, CNM [facility]    Dorathy Kinsman, [place]

## 2022-05-28 NOTE — L&D Delivery Note (Addendum)
OB/GYN Faculty Practice Delivery Note  Terri Miller Terri Miller is a 32 y.o. [phone] s/p SVD at [redacted]w[redacted]d. She was admitted for eIOL.   ROM: 0h 64m with clear fluid GBS Status:  Negative/-- (01/09 1044) Maximum Maternal Temperature:  Temp (24hrs), Avg:98.2 F (36.8 C), Min:98.1 F (36.7 C), Max:98.2 F (36.8 C)    Labor Progress: Patient arrived at 1 cm dilation and was induced with cytotec 50/25 and AROM when she was 5cms  Delivery Date/Time: 06/19/2022 at [phone] Delivery: Called at [phone] that the pt was 10/100/+1 saying that the baby was coming. Came to the room and mother was holding the baby after a nurse delivery. Umbilical cord still attached. Nigel Berthold, CNM, arrived immediately after. Cord clamped x 2 after 1-minute delay, and cut by me. Cord blood drawn. Placenta delivered spontaneously with gentle cord traction. Fundus firm with massage and Pitocin. Labia, perineum, vagina, and cervix inspected.   Placenta: normal spontaneous delivery with gentle traction  Complications: none Lacerations: 1st degree perineal laceration, well approximated EBL: 20cc Analgesia: IV pain medications   Infant: APGAR (1 MIN): 9   APGAR (5 MINS): 9   APGAR (10 MINS):    Weight: pending  Abram Sander, MD  PGY-1, [facility]  06/19/2022 7:05 AM  The above was performed under my direct supervision and guidance.

## 2022-05-29 ENCOUNTER — Encounter: Payer: Self-pay | Admitting: Family Medicine

## 2022-05-29 ENCOUNTER — Telehealth: Payer: Self-pay | Admitting: Family Medicine

## 2022-05-29 NOTE — Telephone Encounter (Signed)
Called patient with the help of the interpret, patient stated she forgot about the appointment. She was told her next appointment date and tie and confirmed she will come.

## 2022-05-29 NOTE — Progress Notes (Deleted)
Patient no show for appointment.

## 2022-06-04 ENCOUNTER — Emergency Department (HOSPITAL_BASED_OUTPATIENT_CLINIC_OR_DEPARTMENT_OTHER)
Admission: EM | Admit: 2022-06-04 | Discharge: 2022-06-04 | Disposition: A | Discharge status: Home / Self Care | Payer: Medicaid Other | Attending: Emergency Medicine | Admitting: Emergency Medicine

## 2022-06-04 ENCOUNTER — Encounter (HOSPITAL_BASED_OUTPATIENT_CLINIC_OR_DEPARTMENT_OTHER): Payer: Self-pay | Admitting: *Deleted

## 2022-06-04 ENCOUNTER — Other Ambulatory Visit: Payer: Self-pay

## 2022-06-04 DIAGNOSIS — O99513 Diseases of the respiratory system complicating pregnancy, third trimester: Secondary | ICD-10-CM | POA: Insufficient documentation

## 2022-06-04 DIAGNOSIS — Z3A37 37 weeks gestation of pregnancy: Secondary | ICD-10-CM | POA: Diagnosis not present

## 2022-06-04 DIAGNOSIS — J101 Influenza due to other identified influenza virus with other respiratory manifestations: Secondary | ICD-10-CM

## 2022-06-04 DIAGNOSIS — Z20822 Contact with and (suspected) exposure to covid-19: Secondary | ICD-10-CM | POA: Insufficient documentation

## 2022-06-04 DIAGNOSIS — O26893 Other specified pregnancy related conditions, third trimester: Secondary | ICD-10-CM | POA: Diagnosis present

## 2022-06-04 LAB — RESP PANEL BY RT-PCR (RSV, FLU A&B, COVID)  RVPGX2
Influenza A by PCR: NEGATIVE
Influenza B by PCR: POSITIVE — AB
Resp Syncytial Virus by PCR: NEGATIVE
SARS Coronavirus 2 by RT PCR: NEGATIVE

## 2022-06-04 NOTE — ED Triage Notes (Signed)
URI with cough and congestion and sinus pressure x2 days

## 2022-06-04 NOTE — Discharge Instructions (Addendum)
You tested positive for the flu. This should get better on it's own over the next 1 week. You can use over-the-counter Mucinex and flonase to help with nasal congestion. Honey is more helpful than medications for cough.

## 2022-06-04 NOTE — ED Provider Notes (Signed)
[facility] EMERGENCY DEPT Provider Note   CSN: [id number] Arrival date & time: 06/04/22  1039     History  Chief Complaint  Patient presents with   URI    Terri Miller is a 32 y.o. female who is currently [redacted] weeks pregnant presenting with 2 days of nasal congestion and cough.  She also reports tactile fever at home although has not checked her temperature.  Has not tried anything for relief.  Denies headache, nausea, vomiting, diarrhea, abdominal pain, chest pain or shortness of breath.  Her daughter is sick with similar symptoms and also being seen today.   Patient feeling baby move normally, no contractions, leakage of fluid, or vaginal bleeding.  Has appointment with her OB/GYN tomorrow.     Home Medications Prior to Admission medications   Medication Sig Start Date End Date Taking? Authorizing Provider  Blood Pressure Monitoring (BLOOD PRESSURE KIT) DEVI 1 Device by Does not apply route as needed. Patient not taking: Reported on 03/26/2022 01/30/22   Myrtie Hawk, DO  chlorpheniramine-HYDROcodone (TUSSIONEX) 10-8 MG/5ML Take 5 mLs by mouth every 12 (twelve) hours as needed for cough. 05/14/22   Katrinka Blazing, [place], CNM  Misc. Devices (GOJJI WEIGHT SCALE) MISC 1 Device by Does not apply route as needed. Patient not taking: Reported on 03/26/2022 01/30/22   Myrtie Hawk, DO  Prenatal Vit-Fe Phos-FA-Omega (VITAFOL GUMMIES) 3.33-0.333-34.8 MG CHEW Chew 1 tablet by mouth daily. 10/31/21   Judeth Horn, NP      Allergies    Patient has no known allergies.    Review of Systems   Review of Systems  Constitutional:  Positive for fever.  HENT:  Positive for congestion. Negative for sore throat.   Respiratory:  Positive for cough. Negative for shortness of breath.   Cardiovascular:  Negative for chest pain.  Gastrointestinal:  Negative for abdominal pain, diarrhea, nausea and vomiting.  Skin:  Negative for rash.  Neurological:   Negative for headaches.    Physical Exam Updated Vital Signs BP 100/66   Pulse (!) 103   Temp 97.9 F (36.6 C) (Oral)   Resp 16   Wt 67.3 kg   LMP 06/13/2021 (Exact Date)   SpO2 100%   BMI 24.67 kg/m  Physical Exam Constitutional:      General: She is not in acute distress.    Appearance: Normal appearance. She is not toxic-appearing.  HENT:     Head: Normocephalic and atraumatic.     Nose: Congestion present.     Mouth/Throat:     Mouth: Mucous membranes are moist.     Pharynx: No oropharyngeal exudate or posterior oropharyngeal erythema.  Eyes:     Conjunctiva/sclera: Conjunctivae normal.  Cardiovascular:     Rate and Rhythm: Normal rate and regular rhythm.     Heart sounds: Normal heart sounds.  Pulmonary:     Effort: Pulmonary effort is normal.     Breath sounds: Normal breath sounds.  Abdominal:     Palpations: Abdomen is soft.     Tenderness: There is no abdominal tenderness.  Musculoskeletal:     Cervical back: Neck supple.  Skin:    General: Skin is warm and dry.     Capillary Refill: Capillary refill takes less than 2 seconds.  Neurological:     General: No focal deficit present.     Mental Status: She is alert. Mental status is at baseline.     ED Results / Procedures / Treatments   Labs (all  labs ordered are listed, but only abnormal results are displayed) Labs Reviewed  RESP PANEL BY RT-PCR (RSV, FLU A&B, COVID)  RVPGX2 - Abnormal; Notable for the following components:      Result Value   Influenza B by PCR POSITIVE (*)    All other components within normal limits    EKG None  Radiology No results found.  Procedures Procedures   Medications Ordered in ED Medications - No data to display  ED Course/ Medical Decision Making/ A&P                           Medical Decision Making  This is a 32 year old female presenting with 2 days of nasal congestion and cough in the setting of known sick contacts.  Vitals and exam are reassuring,  only mild tachycardia noted which is not unexpected as she is [redacted] weeks pregnant.  No red flags on history.  Suspect viral illness, COVID/flu/RSV swab sent.   Viral quad screen positive for influenza B. Stable for discharge home with recommendations for supportive care.  Return precautions reviewed.  Patient with normal fetal movement, no contractions, no leakage of fluid or vaginal bleeding.  Has routine prenatal appointment tomorrow.  Final Clinical Impression(s) / ED Diagnoses Final diagnoses:  Influenza B    Rx / DC Orders ED Discharge Orders     None       Alcus Dad, MD PGY-3, [place]    Alcus Dad, MD 06/04/22 [place], Alvin Critchley, DO 06/05/22 [phone]

## 2022-06-05 ENCOUNTER — Other Ambulatory Visit (HOSPITAL_COMMUNITY)
Admission: RE | Admit: 2022-06-05 | Discharge: 2022-06-05 | Disposition: A | Discharge status: Home / Self Care | Payer: Medicaid Other | Source: Ambulatory Visit | Attending: Obstetrics and Gynecology | Admitting: Obstetrics and Gynecology

## 2022-06-05 ENCOUNTER — Ambulatory Visit (INDEPENDENT_AMBULATORY_CARE_PROVIDER_SITE_OTHER): Payer: Medicaid Other | Admitting: Obstetrics and Gynecology

## 2022-06-05 VITALS — BP 113/64 | HR 115 | Wt 151.0 lb

## 2022-06-05 DIAGNOSIS — Z3480 Encounter for supervision of other normal pregnancy, unspecified trimester: Secondary | ICD-10-CM | POA: Diagnosis present

## 2022-06-05 DIAGNOSIS — J069 Acute upper respiratory infection, unspecified: Secondary | ICD-10-CM

## 2022-06-05 DIAGNOSIS — Z789 Other specified health status: Secondary | ICD-10-CM

## 2022-06-05 DIAGNOSIS — O2613 Low weight gain in pregnancy, third trimester: Secondary | ICD-10-CM

## 2022-06-05 DIAGNOSIS — Z3A37 37 weeks gestation of pregnancy: Secondary | ICD-10-CM

## 2022-06-05 DIAGNOSIS — R051 Acute cough: Secondary | ICD-10-CM

## 2022-06-05 MED ORDER — HYDROCOD POLI-CHLORPHE POLI ER 10-8 MG/5ML PO SUER: 5.0000 mL | Freq: Two times a day (BID) | ORAL | 0 refills | Status: DC | PRN

## 2022-06-05 NOTE — Progress Notes (Signed)
   PRENATAL VISIT NOTE  Subjective:  Terri Miller is a 32 y.o. [phone] at [redacted]w[redacted]d being seen today for ongoing prenatal care.  She is currently monitored for the following issues for this low-risk pregnancy and has Language barrier; Supervision of other normal pregnancy, antepartum; and Poor weight gain of pregnancy, third trimester on their problem list.  Patient reports no complaints.  Contractions: Irritability. Vag. Bleeding: None.  Movement: Present. Denies leaking of fluid.   The following portions of the patient's history were reviewed and updated as appropriate: allergies, current medications, past family history, past medical history, past social history, past surgical history and problem list.   Objective:   Vitals:   06/05/22 1033  BP: 113/64  Pulse: (!) 115  Weight: 151 lb (68.5 kg)    Fetal Status: Fetal Heart Rate (bpm): 155   Movement: Present     General:  Alert, oriented and cooperative. Patient is in no acute distress.  Skin: Skin is warm and dry. No rash noted.   Cardiovascular: Normal heart rate noted  Respiratory: Normal respiratory effort, no problems with respiration noted  Abdomen: Soft, gravid, appropriate for gestational age.  Pain/Pressure: Present     Pelvic: Cervical exam performed in the presence of a chaperone        Extremities: Normal range of motion.  Edema: None  Mental Status: Normal mood and affect. Normal behavior. Normal judgment and thought content.   Assessment and Plan:  Pregnancy: [id number] at [redacted]w[redacted]d 1. Language barrier Interpreter used throughout  2. Supervision of other normal pregnancy, antepartum Cultures today  3. Poor weight gain of pregnancy, third trimester Interval weight gain from last appt was 1 lb.  FH is appropriate as is leopolds.    Term labor symptoms and general obstetric precautions including but not limited to vaginal bleeding, contractions, leaking of fluid and fetal movement were reviewed in detail  with the patient. Please refer to After Visit Summary for other counseling recommendations.   No follow-ups on file.  Future Appointments  Date Time Provider Department Center  06/12/2022 10:15 AM Tresea Mall, CNM [facility]  06/19/2022 10:15 AM Gabriel Carina, CNM [facility]  06/27/2022  1:15 PM [facility]  06/27/2022  2:35 PM Helaine Chess [facility]    Radene Gunning, MD

## 2022-06-06 LAB — GC/CHLAMYDIA PROBE AMP (~~LOC~~) NOT AT ARMC
Chlamydia: NEGATIVE
Comment: NEGATIVE
Comment: NORMAL
Neisseria Gonorrhea: NEGATIVE

## 2022-06-09 LAB — CULTURE, BETA STREP (GROUP B ONLY): Strep Gp B Culture: NEGATIVE

## 2022-06-12 ENCOUNTER — Ambulatory Visit: Payer: Medicaid Other | Admitting: Advanced Practice Midwife

## 2022-06-12 ENCOUNTER — Other Ambulatory Visit: Payer: Self-pay

## 2022-06-12 ENCOUNTER — Encounter: Payer: Self-pay | Admitting: Advanced Practice Midwife

## 2022-06-12 VITALS — BP 102/61 | HR 74 | Wt 152.0 lb

## 2022-06-12 DIAGNOSIS — Z3480 Encounter for supervision of other normal pregnancy, unspecified trimester: Secondary | ICD-10-CM

## 2022-06-12 DIAGNOSIS — Z3A38 38 weeks gestation of pregnancy: Secondary | ICD-10-CM

## 2022-06-12 NOTE — Patient Instructions (Signed)
Induction scheduled for 06/18/2022 at 11:45pm

## 2022-06-12 NOTE — Progress Notes (Signed)
   PRENATAL VISIT NOTE  Subjective:  Terri Miller is a 32 y.o. [phone] at [redacted]w[redacted]d being seen today for ongoing prenatal care.  She is currently monitored for the following issues for this low-risk pregnancy and has Language barrier; Supervision of other normal pregnancy, antepartum; and Poor weight gain of pregnancy, third trimester on their problem list.  Patient reports no complaints.  Contractions: Irregular. Vag. Bleeding: None.  Movement: Present. Denies leaking of fluid.   The following portions of the patient's history were reviewed and updated as appropriate: allergies, current medications, past family history, past medical history, past social history, past surgical history and problem list.   Objective:   Vitals:   06/12/22 1007  BP: 102/61  Pulse: 74  Weight: 152 lb (68.9 kg)    Fetal Status: Fetal Heart Rate (bpm): 154 Fundal Height: 38 cm Movement: Present     General:  Alert, oriented and cooperative. Patient is in no acute distress.  Skin: Skin is warm and dry. No rash noted.   Cardiovascular: Normal heart rate noted  Respiratory: Normal respiratory effort, no problems with respiration noted  Abdomen: Soft, gravid, appropriate for gestational age.  Pain/Pressure: Present     Pelvic: Cervical exam deferred        Extremities: Normal range of motion.  Edema: None  Mental Status: Normal mood and affect. Normal behavior. Normal judgment and thought content.   Assessment and Plan:  Pregnancy: [id number] at [redacted]w[redacted]d 1. Supervision of other normal pregnancy, antepartum -routine care   2. [redacted] weeks gestation of pregnancy - Patient requesting eIOL at term  - Appt available for 1/22 at [facility] - Orders placed  - GBS negative   Term labor symptoms and general obstetric precautions including but not limited to vaginal bleeding, contractions, leaking of fluid and fetal movement were reviewed in detail with the patient. Please refer to After Visit Summary for other  counseling recommendations.   Return in about 7 weeks (around 07/31/2022) for PP visit .  Future Appointments  Date Time Provider [place]  06/19/2022 12:00 AM [facility] [place] [facility] None  06/19/2022 10:15 AM Gabriel Carina, CNM [facility]  06/27/2022  1:15 PM [facility]  06/27/2022  2:35 PM Gabriel Carina, CNM [facility] [place] DNP, CNM  06/12/22  10:27 AM

## 2022-06-13 ENCOUNTER — Other Ambulatory Visit: Payer: Self-pay | Admitting: Advanced Practice Midwife

## 2022-06-17 ENCOUNTER — Other Ambulatory Visit: Payer: Self-pay

## 2022-06-19 ENCOUNTER — Other Ambulatory Visit: Payer: Self-pay

## 2022-06-19 ENCOUNTER — Inpatient Hospital Stay (HOSPITAL_COMMUNITY): Payer: Medicaid Other

## 2022-06-19 ENCOUNTER — Encounter: Payer: Self-pay | Admitting: Certified Nurse Midwife

## 2022-06-19 ENCOUNTER — Encounter (HOSPITAL_COMMUNITY): Payer: Self-pay | Admitting: Family Medicine

## 2022-06-19 ENCOUNTER — Inpatient Hospital Stay (HOSPITAL_COMMUNITY)
Admission: RE | Admit: 2022-06-19 | Discharge: 2022-06-20 | DRG: 807 | Disposition: A | Discharge status: Home / Self Care | Payer: Medicaid Other | Attending: Obstetrics and Gynecology | Admitting: Obstetrics and Gynecology

## 2022-06-19 DIAGNOSIS — O26893 Other specified pregnancy related conditions, third trimester: Secondary | ICD-10-CM | POA: Diagnosis present

## 2022-06-19 DIAGNOSIS — Z30017 Encounter for initial prescription of implantable subdermal contraceptive: Secondary | ICD-10-CM | POA: Diagnosis not present

## 2022-06-19 DIAGNOSIS — Z3490 Encounter for supervision of normal pregnancy, unspecified, unspecified trimester: Secondary | ICD-10-CM

## 2022-06-19 DIAGNOSIS — Z3A39 39 weeks gestation of pregnancy: Secondary | ICD-10-CM

## 2022-06-19 DIAGNOSIS — Z3480 Encounter for supervision of other normal pregnancy, unspecified trimester: Secondary | ICD-10-CM

## 2022-06-19 LAB — CBC
HCT: 34.5 % — ABNORMAL LOW (ref 36.0–46.0)
Hemoglobin: 11.4 g/dL — ABNORMAL LOW (ref 12.0–15.0)
MCH: 27.8 pg (ref 26.0–34.0)
MCHC: 33 g/dL (ref 30.0–36.0)
MCV: 84.1 fL (ref 80.0–100.0)
Platelets: 227 10*3/uL (ref 150–400)
RBC: 4.1 MIL/uL (ref 3.87–5.11)
RDW: 15.3 % (ref 11.5–15.5)
WBC: 8.9 10*3/uL (ref 4.0–10.5)
nRBC: 0 % (ref 0.0–0.2)

## 2022-06-19 LAB — TYPE AND SCREEN
ABO/RH(D): O POS
Antibody Screen: NEGATIVE

## 2022-06-19 LAB — RPR: RPR Ser Ql: NONREACTIVE

## 2022-06-19 MED ORDER — BENZOCAINE-MENTHOL 20-0.5 % EX AERO: 1.0000 | INHALATION_SPRAY | CUTANEOUS | Status: DC | PRN

## 2022-06-19 MED ORDER — PRENATAL MULTIVITAMIN CH
1.0000 | ORAL_TABLET | Freq: Every day | ORAL | Status: DC
  Administered 2022-06-19 – 2022-06-20 (×2): 1 via ORAL
  Filled 2022-06-19 (×2): qty 1

## 2022-06-19 MED ORDER — OXYTOCIN BOLUS FROM INFUSION
333.0000 mL | Freq: Once | INTRAVENOUS | Status: AC
  Administered 2022-06-19: 333 mL via INTRAVENOUS

## 2022-06-19 MED ORDER — ONDANSETRON HCL 4 MG/2ML IJ SOLN: 4.0000 mg | Freq: Four times a day (QID) | INTRAMUSCULAR | Status: DC | PRN

## 2022-06-19 MED ORDER — IBUPROFEN 600 MG PO TABS
600.0000 mg | ORAL_TABLET | Freq: Four times a day (QID) | ORAL | Status: DC
  Administered 2022-06-19 – 2022-06-20 (×5): 600 mg via ORAL
  Filled 2022-06-19 (×5): qty 1

## 2022-06-19 MED ORDER — SOD CITRATE-CITRIC ACID 500-334 MG/5ML PO SOLN: 30.0000 mL | ORAL | Status: DC | PRN

## 2022-06-19 MED ORDER — MISOPROSTOL 25 MCG QUARTER TABLET
25.0000 ug | ORAL_TABLET | Freq: Once | ORAL | Status: AC
  Administered 2022-06-19: 25 ug via VAGINAL
  Filled 2022-06-19: qty 1

## 2022-06-19 MED ORDER — ACETAMINOPHEN 325 MG PO TABS: 650.0000 mg | ORAL_TABLET | ORAL | Status: DC | PRN

## 2022-06-19 MED ORDER — OXYCODONE-ACETAMINOPHEN 5-325 MG PO TABS: 2.0000 | ORAL_TABLET | ORAL | Status: DC | PRN

## 2022-06-19 MED ORDER — LACTATED RINGERS IV SOLN: 500.0000 mL | INTRAVENOUS | Status: DC | PRN

## 2022-06-19 MED ORDER — FENTANYL CITRATE (PF) 100 MCG/2ML IJ SOLN
50.0000 ug | INTRAMUSCULAR | Status: DC | PRN
  Administered 2022-06-19 (×2): 100 ug via INTRAVENOUS
  Administered 2022-06-19: 50 ug via INTRAVENOUS
  Filled 2022-06-19 (×3): qty 2

## 2022-06-19 MED ORDER — OXYTOCIN-SODIUM CHLORIDE 30-0.9 UT/500ML-% IV SOLN
2.5000 [IU]/h | INTRAVENOUS | Status: DC
  Administered 2022-06-19: 2.5 [IU]/h via INTRAVENOUS
  Filled 2022-06-19: qty 500

## 2022-06-19 MED ORDER — ONDANSETRON HCL 4 MG PO TABS: 4.0000 mg | ORAL_TABLET | ORAL | Status: DC | PRN

## 2022-06-19 MED ORDER — OXYTOCIN-SODIUM CHLORIDE 30-0.9 UT/500ML-% IV SOLN: 1.0000 m[IU]/min | INTRAVENOUS | Status: DC

## 2022-06-19 MED ORDER — SENNOSIDES-DOCUSATE SODIUM 8.6-50 MG PO TABS
2.0000 | ORAL_TABLET | Freq: Every day | ORAL | Status: DC
  Administered 2022-06-20: 2 via ORAL
  Filled 2022-06-19: qty 2

## 2022-06-19 MED ORDER — ZOLPIDEM TARTRATE 5 MG PO TABS: 5.0000 mg | ORAL_TABLET | Freq: Every evening | ORAL | Status: DC | PRN

## 2022-06-19 MED ORDER — ACETAMINOPHEN 325 MG PO TABS
650.0000 mg | ORAL_TABLET | ORAL | Status: DC | PRN
  Administered 2022-06-19 (×2): 650 mg via ORAL
  Filled 2022-06-19 (×2): qty 2

## 2022-06-19 MED ORDER — OXYCODONE-ACETAMINOPHEN 5-325 MG PO TABS: 1.0000 | ORAL_TABLET | ORAL | Status: DC | PRN

## 2022-06-19 MED ORDER — COCONUT OIL OIL: 1.0000 | TOPICAL_OIL | Status: DC | PRN

## 2022-06-19 MED ORDER — TETANUS-DIPHTH-ACELL PERTUSSIS 5-2.5-18.5 LF-MCG/0.5 IM SUSY: 0.5000 mL | PREFILLED_SYRINGE | Freq: Once | INTRAMUSCULAR | Status: DC

## 2022-06-19 MED ORDER — ONDANSETRON HCL 4 MG/2ML IJ SOLN: 4.0000 mg | INTRAMUSCULAR | Status: DC | PRN

## 2022-06-19 MED ORDER — WITCH HAZEL-GLYCERIN EX PADS: 1.0000 | MEDICATED_PAD | CUTANEOUS | Status: DC | PRN

## 2022-06-19 MED ORDER — TERBUTALINE SULFATE 1 MG/ML IJ SOLN: 0.2500 mg | Freq: Once | INTRAMUSCULAR | Status: DC | PRN

## 2022-06-19 MED ORDER — MISOPROSTOL 50MCG HALF TABLET
50.0000 ug | ORAL_TABLET | Freq: Once | ORAL | Status: AC
  Administered 2022-06-19: 50 ug via ORAL
  Filled 2022-06-19: qty 1

## 2022-06-19 MED ORDER — DIPHENHYDRAMINE HCL 25 MG PO CAPS: 25.0000 mg | ORAL_CAPSULE | Freq: Four times a day (QID) | ORAL | Status: DC | PRN

## 2022-06-19 MED ORDER — SIMETHICONE 80 MG PO CHEW: 80.0000 mg | CHEWABLE_TABLET | ORAL | Status: DC | PRN

## 2022-06-19 MED ORDER — LIDOCAINE HCL (PF) 1 % IJ SOLN: 30.0000 mL | INTRAMUSCULAR | Status: DC | PRN

## 2022-06-19 MED ORDER — DIBUCAINE (PERIANAL) 1 % EX OINT: 1.0000 | TOPICAL_OINTMENT | CUTANEOUS | Status: DC | PRN

## 2022-06-19 MED ORDER — OXYCODONE HCL 5 MG PO TABS
5.0000 mg | ORAL_TABLET | Freq: Four times a day (QID) | ORAL | Status: DC | PRN
  Administered 2022-06-19 – 2022-06-20 (×4): 5 mg via ORAL
  Filled 2022-06-19 (×4): qty 1

## 2022-06-19 MED ORDER — LACTATED RINGERS IV SOLN: INTRAVENOUS | Status: DC

## 2022-06-19 NOTE — H&P (Cosign Needed Addendum)
OBSTETRIC ADMISSION HISTORY AND PHYSICAL  Terri Miller is a 32 y.o. female [phone] with IUP at [redacted]w[redacted]d by early [place] presenting for eIOL. She reports +FMs, No LOF, no VB, no blurry vision, headaches or peripheral edema, and RUQ pain.  She plans on breast and bottle feeding. She requests inpatient nexplanon for birth control. She received her prenatal care at  [place]: By early [place] --->  Estimated Date of Delivery: 06/22/22  [place] 02/05/22:    @[redacted]w[redacted]d , normal anatomy, cephalic presentation, 694 gm 87% EFW    Prenatal History/Complications: None  Past Medical History: Past Medical History:  Diagnosis Date   Medical history non-contributory     Past Surgical History: Past Surgical History:  Procedure Laterality Date   NO PAST SURGERIES      Obstetrical History: OB History     Gravida  6   Para  3   Term  3   Preterm  0   AB  2   Living  3      SAB  2   IAB  0   Ectopic  0   Multiple      Live Births  3           Social History Social History   Socioeconomic History   Marital status: Married    Spouse name: Ousseini   Number of children: Not on file   Years of education: Not on file   Highest education level: Not on file  Occupational History   Not on file  Tobacco Use   Smoking status: Never   Smokeless tobacco: Never  Substance and Sexual Activity   Alcohol use: Yes   Drug use: Not Currently   Sexual activity: Not Currently    Birth control/protection: Implant, None  Other Topics Concern   Not on file  Social History Narrative   ** Merged History Encounter **       Social Determinants of Health   Financial Resource Strain: Low Risk  (07/07/2018)   Overall Financial Resource Strain (CARDIA)    Difficulty of Paying Living Expenses: Not hard at all  Food Insecurity: No Food Insecurity (09/29/2020)   Hunger Vital Sign    Worried About Running Out of Food in the Last Year: Never true    Ran Out of Food in the Last Year:  Never true  Transportation Needs: No Transportation Needs (09/29/2020)   PRAPARE - Hydrologist (Medical): No    Lack of Transportation (Non-Medical): No  Physical Activity: Unknown (07/07/2018)   Exercise Vital Sign    Days of Exercise per Week: Patient refused    Minutes of Exercise per Session: Patient refused  Stress: No Stress Concern Present (07/07/2018)   [place]    Feeling of Stress : Not at all  Social Connections: Unknown (07/07/2018)   Social Connection and Isolation Panel [NHANES]    Frequency of Communication with Friends and Family: Patient refused    Frequency of Social Gatherings with Friends and Family: Patient refused    Attends Religious Services: Patient refused    Marine scientist or Organizations: Patient refused    Attends Music therapist: Patient refused    Marital Status: Patient refused    Family History: Family History  Problem Relation Age of Onset   Healthy Mother    Healthy Father     Allergies: No Known Allergies  Medications  Prior to Admission  Medication Sig Dispense Refill Last Dose   Blood Pressure Monitoring (BLOOD PRESSURE KIT) DEVI 1 Device by Does not apply route as needed. 1 each 0    Misc. Devices (GOJJI WEIGHT SCALE) MISC 1 Device by Does not apply route as needed. 1 each 0    Prenatal Vit-Fe Phos-FA-Omega (VITAFOL GUMMIES) 3.33-0.333-34.8 MG CHEW Chew 1 tablet by mouth daily. 90 tablet 5      Review of Systems   All systems reviewed and negative except as stated in HPI  Last menstrual period 06/13/2021, currently breastfeeding. General appearance: alert, cooperative, and appears stated age Lungs: clear to auscultation bilaterally Heart: regular rate and rhythm Abdomen: soft, non-tender; bowel sounds normal Extremities: Homans sign is negative, no sign of DVT Presentation: cephalic Fetal monitoringBaseline: 120  bpm, Variability: Good {> 6 bpm), Accelerations: Reactive, and Decelerations: Absent Uterine activityirregular     Prenatal labs: ABO, Rh: O/Positive/-- (09/05 1619) Antibody: Negative (09/05 1619) Rubella: 25.00 (09/05 1619) RPR: Non Reactive (10/30 0839)  HBsAg: Negative (09/05 1619)  HIV: Non Reactive (10/30 0839)  GBS: Negative/-- (01/09 1044)  2 hr Glucola nml Genetic screening  nml Anatomy US nml        Prenatal Transfer Tool  Maternal Diabetes: No Genetic Screening: Normal Maternal Ultrasounds/Referrals: Normal Fetal Ultrasounds or other Referrals:  None Maternal Substance Abuse:  No Significant Maternal Medications:  None Significant Maternal Lab Results:  Group B Strep negative Number of Prenatal Visits:greater than 3 verified prenatal visits Other Comments:   language barrier, french   No results found for this or any previous visit (from the past 24 hour(s)).  Patient Active Problem List   Diagnosis Date Noted   Term pregnancy 06/19/2022   Poor weight gain of pregnancy, third trimester 05/14/2022   Supervision of other normal pregnancy, antepartum 01/30/2022   Language barrier 01/09/2018    Assessment/Plan:  Terri Miller is a 32 y.o. [id number] at [redacted]w[redacted]d here for eIOL  #Labor: Will start with misoprostol orally and vaginally, in 4 hours will recheck and consider placing a FB and/or starting pitocin  #Pain: IV pain medication  #FWB: Cat I #ID: GBS neg #MOF: both #MOC:nexplanon, to be placed here #Circ:  yes  Abram Sander, MD  06/19/2022, 12:25 AM  I personally saw and evaluated the patient, performing the key elements of the service. I developed and verified the management plan that is described in the resident's/student's note, and I agree with the content with my edits above. VSS, HRR&R, Resp unlabored, Legs neg.  Nigel Berthold, CNM 06/19/2022 7:54 AM

## 2022-06-19 NOTE — Lactation Note (Signed)
This note was copied from a baby's chart. Lactation Consultation Note  Patient Name: Terri Miller NATFT'D Date: 06/19/2022 Reason for consult: Initial assessment Age:30 hours  P4, Per RN mother declined interpretation and father signed paper to interpret.  Mother speaks english and father assisted. Mother wants to breastfeed and formula feed.   In L&D formula was attempted but baby only took 1 ml. Attempted latching,baby was sleepy and did not sustain latch.  Suggest offering breast before formula. Instructed to place baby skin to skin within the next 1-2 hours to interest baby in feeding.  Call for help when needed. Lactation information sheet given.   Maternal Data Has patient been taught Hand Expression?: Yes Does the patient have breastfeeding experience prior to this delivery?: Yes How long did the patient breastfeed?: 14 mos.  Feeding Mother's Current Feeding Choice: Breast Milk and Formula  Interventions Interventions: Breast feeding basics reviewed;Assisted with latch;Skin to skin;Education;LC Services brochure  Discharge    Consult Status Consult Status: Follow-up Date: 06/20/22 Follow-up type: In-patient    [name] [facility] 06/19/2022, 9:13 AM

## 2022-06-19 NOTE — Progress Notes (Signed)
Patient is still having increased cramping after ibuprofen. Provided tylenol; however, patient would like additional medications due to the severity of cramps. Encouraged patient to empty bladder at least every 2-3 hours. Called Dr. Trina Ao for additional pain medications. Dr. Trina Ao ordered oxycodone prn. Maxwell Caul, Leretha Dykes [place]

## 2022-06-19 NOTE — Progress Notes (Signed)
Terri Miller is a 32 y.o. [phone] at [redacted]w[redacted]d by ultrasound admitted for eIOL  Subjective: Pt is feeling increasingly uncomfortable with contractions beginning to be more powerful. Still wanting to use IV pain medications only.   Objective: BP 116/83   Pulse 85   Temp 98.2 F (36.8 C) (Oral)   Resp 16   Ht 5\' 1"  (1.549 m)   Wt 69.4 kg   LMP 06/13/2021 (Exact Date)   SpO2 100%   BMI 28.91 kg/m  No intake/output data recorded. No intake/output data recorded.  FHT:  FHR: 120 bpm, variability: moderate,  accelerations:  Present,  early decel noted UC:   regular, every 5 minutes SVE:   Dilation: 5 Effacement (%): 90 Station: -2 Exam by:: Dr. Milagros Loll  Labs: Lab Results  Component Value Date   WBC 8.9 06/19/2022   HGB 11.4 (L) 06/19/2022   HCT 34.5 (L) 06/19/2022   MCV 84.1 06/19/2022   PLT 227 06/19/2022    Assessment / Plan: eIOL, s/p cyto 50/25 administered at 0130.  Pt making change and AROMed, will await further change.   Labor: Progressing normally Fetal Wellbeing:  Category I Pain Control:  IV pain meds I/D:  GBS-  Anticipated MOD:  NSVD  Abram Sander, MD 06/19/2022, 5:52 AM

## 2022-06-19 NOTE — Discharge Summary (Signed)
Postpartum Discharge Summary  Date of Service updated***     Patient Name: Terri Miller DOB: 28-Apr-1991 MRN: [id number]  Date of admission: 06/19/2022 Delivery date:06/19/2022  Delivering provider: Christin Fudge  Date of discharge: 06/19/2022  Admitting diagnosis: Term pregnancy [Z34.90] Intrauterine pregnancy: [redacted]w[redacted]d     Secondary diagnosis:  Principal Problem:   Term pregnancy  Additional problems: ***    Discharge diagnosis: Term Pregnancy Delivered                                              Post partum procedures: Nexplanon   *** Augmentation: AROM and Cytotec Complications: None  Hospital course: Induction of Labor With Vaginal Delivery   32 y.o. yo [phone] at [redacted]w[redacted]d was admitted to the hospital 06/19/2022 for induction of labor.  Indication for induction: Elective.  Patient had an labor course complicated by precipitous delivery Membrane Rupture Time/Date: 5:49 AM ,06/19/2022   Delivery Method:Vaginal, Spontaneous  Episiotomy: None  Lacerations:  1st degree  Details of delivery can be found in separate delivery note.  Patient had a postpartum course complicated by***. Patient is discharged home 06/19/22.  Newborn Data: Birth date:06/19/2022  Birth time:6:43 AM  Gender:Female  Living status:Living  Apgars:9 ,9  Weight:   Magnesium Sulfate received: No BMZ received: No Rhophylac:N/A MMR:N/A T-DaP:Given prenatally Flu: Yes Transfusion:{Transfusion received:30440034}  Physical exam  Vitals:   06/19/22 0535 06/19/22 0540 06/19/22 0702 06/19/22 0710  BP:   133/85   Pulse:   89   Resp:   18   Temp:      TempSrc:      SpO2: 100% 100%  98%  Weight:      Height:       General: {Exam; general:21111117} Lochia: {Desc; appropriate/inappropriate:30686::"appropriate"} Uterine Fundus: {Desc; firm/soft:30687} Incision: {Exam; incision:21111123} DVT Evaluation: {Exam; dvt:2111122} Labs: Lab Results  Component Value Date   WBC 8.9  06/19/2022   HGB 11.4 (L) 06/19/2022   HCT 34.5 (L) 06/19/2022   MCV 84.1 06/19/2022   PLT 227 06/19/2022      Latest Ref Rng & Units 02/15/2018   11:42 AM  CMP  Glucose 70 - 99 mg/dL 64   BUN 6 - 20 mg/dL 6   Creatinine 0.44 - 1.00 mg/dL 0.53   Sodium 135 - 145 mmol/L 137   Potassium 3.5 - 5.1 mmol/L 3.7   Chloride 98 - 111 mmol/L 107   CO2 22 - 32 mmol/L 20   Calcium 8.9 - 10.3 mg/dL 8.7   Total Protein 6.5 - 8.1 g/dL 6.6   Total Bilirubin 0.3 - 1.2 mg/dL 1.0   Alkaline Phos 38 - 126 U/L 55   AST 15 - 41 U/L 15   ALT 0 - 44 U/L 11    Edinburgh Score:    08/12/2018    1:31 PM  Edinburgh Postnatal Depression Scale Screening Tool  I have been able to laugh and see the funny side of things. 0  I have looked forward with enjoyment to things. 0  I have blamed myself unnecessarily when things went wrong. 0  I have been anxious or worried for no good reason. 0  I have felt scared or panicky for no good reason. 0  Things have been getting on top of me. 0  I have been so unhappy that I have had difficulty sleeping. 0  I have felt sad or miserable. 0  I have been so unhappy that I have been crying. 0  The thought of harming myself has occurred to me. 0  Edinburgh Postnatal Depression Scale Total 0     After visit meds:  Allergies as of 06/19/2022       Reactions   Pork-derived Products    Pt states she is Muslim and does not eat pork     Med Rec must be completed prior to using this [facility]***        Discharge home in stable condition Infant Feeding: {Baby feeding:23562} Infant Disposition:{CHL IP OB HOME WITH VQQVZD:63875} Discharge instruction: per After Visit Summary and Postpartum booklet. Activity: Advance as tolerated. Pelvic rest for 6 weeks.  Diet: {OB IEPP:29518841} Future Appointments:No future appointments. Follow up Visit:   Please schedule this patient for a Virtual postpartum visit in 4 weeks with the following provider: Any  provider. Additional Postpartum F/U:  Low risk pregnancy complicated by:  Delivery mode:  Vaginal, Spontaneous  Anticipated Birth Control:  PP Nexplanon placed ***   06/19/2022 Christin Fudge, CNM

## 2022-06-20 ENCOUNTER — Other Ambulatory Visit (HOSPITAL_COMMUNITY): Payer: Self-pay

## 2022-06-20 ENCOUNTER — Encounter (HOSPITAL_COMMUNITY): Payer: Self-pay | Admitting: Obstetrics and Gynecology

## 2022-06-20 DIAGNOSIS — Z30017 Encounter for initial prescription of implantable subdermal contraceptive: Secondary | ICD-10-CM

## 2022-06-20 HISTORY — PX: NEXPLANON TRAY: NUR84248

## 2022-06-20 MED ORDER — LIDOCAINE HCL 1 % IJ SOLN
0.0000 mL | Freq: Once | INTRAMUSCULAR | Status: AC | PRN
  Administered 2022-06-20: 20 mL via INTRADERMAL
  Filled 2022-06-20: qty 20

## 2022-06-20 MED ORDER — ETONOGESTREL 68 MG ~~LOC~~ IMPL
68.0000 mg | DRUG_IMPLANT | Freq: Once | SUBCUTANEOUS | Status: AC
  Administered 2022-06-20: 68 mg via SUBCUTANEOUS
  Filled 2022-06-20: qty 1

## 2022-06-20 MED ORDER — SENNOSIDES-DOCUSATE SODIUM 8.6-50 MG PO TABS
2.0000 | ORAL_TABLET | Freq: Every day | ORAL | 0 refills | Status: DC
  Filled 2022-06-20: qty 30, 15d supply, fill #0

## 2022-06-20 MED ORDER — PRENATAL MULTIVITAMIN CH
1.0000 | ORAL_TABLET | Freq: Every day | ORAL | 0 refills | Status: AC
  Filled 2022-06-20: qty 30, 30d supply, fill #0

## 2022-06-20 MED ORDER — IBUPROFEN 600 MG PO TABS
600.0000 mg | ORAL_TABLET | Freq: Four times a day (QID) | ORAL | 0 refills | Status: DC
  Filled 2022-06-20: qty 30, 8d supply, fill #0

## 2022-06-20 MED ORDER — SIMETHICONE 80 MG PO CHEW
80.0000 mg | CHEWABLE_TABLET | ORAL | 0 refills | Status: DC | PRN
  Filled 2022-06-20: qty 30, 7d supply, fill #0

## 2022-06-20 MED ORDER — ACETAMINOPHEN 325 MG PO TABS
650.0000 mg | ORAL_TABLET | ORAL | 0 refills | Status: DC | PRN
  Filled 2022-06-20: qty 30, 3d supply, fill #0

## 2022-06-20 MED ORDER — BENZOCAINE-MENTHOL 20-0.5 % EX AERO
1.0000 | INHALATION_SPRAY | CUTANEOUS | 0 refills | Status: DC | PRN
  Filled 2022-06-20: qty 56, fill #0

## 2022-06-20 NOTE — Procedures (Signed)
Post-Partum Nexplanon Insertion Procedure Note  Patient was identified. Informed consent was signed, signed copy in chart. A time-out was performed.    The insertion site was identified 8-10 cm (3-4 inches) from the medial epicondyle of the humerus and 3-5 cm (1.25-2 inches) posterior to (below) the sulcus (groove) between the biceps and triceps muscles of the patient's left arm and marked. The site was prepped and draped in the usual sterile fashion. Pt was prepped with alcohol swab and then injected with 3 cc of 1% lidocaine. The site was prepped with betadine. Nexplanon removed form packaging,  Device confirmed in needle, then inserted full length of needle and withdrawn per handbook instructions. Provider and patient verified presence of the implant in the woman's arm by palpation. Pt insertion site was covered with steristrips/adhesive bandage and pressure bandage. There was minimal blood loss. Patient tolerated procedure well.  Patient was given post procedure instructions and Nexplanon user card with expiration date. Condoms were recommended for STI prevention. Patient was asked to keep the pressure dressing on for 24 hours to minimize bruising and keep the adhesive bandage on for 3-5 days. The patient verbalized understanding of the plan of care and agrees.   Lot # [id number]  Expiration Date: 03/16/2024  Liliane Channel MD MPH OB Fellow, [facility] 06/20/2022

## 2022-06-20 NOTE — Lactation Note (Signed)
This note was copied from a baby's chart. Lactation Consultation Note  Patient Name: Terri Miller KCLEX'N Date: 06/20/2022 Reason for consult: Follow-up assessment Age:30 hours   P4: Term infant at 39+4 weeks Feeding preference: Breast/formula  Weight loss: 5%  Mother does not wish to have an interpreter.  She speaks some [place] and father assists as the interpreter.  Mother was resting when I arrived; baby was in the nursery receiving a circumcision.  Mother had no questions/concerns related to breast feeding.  She has been breast feeding and supplementing with formula.  Mother requested a manual pump for home use.  Provided the manual pump and suggested the #21 flanges for the best fit.  Demonstrated sizing, mother did not wish to review pump assembly.  Discussed the possibility of sleepiness after the circumcision; informed parents that baby may cluster feed tonight if he sleeps more today.  Family has our op phone number for any questions/concerns after discharge.   Maternal Data    Feeding Nipple Type: Slow - flow  LATCH Score                    Lactation Tools Discussed/Used    Interventions Interventions: Education  Discharge Pump: Manual  Consult Status Consult Status: Complete Date: 06/20/22 Follow-up type: Call as needed    Lanice Schwab Ed Rayson 06/20/2022, 11:31 AM

## 2022-06-27 ENCOUNTER — Encounter: Payer: Self-pay | Admitting: Certified Nurse Midwife

## 2022-06-27 ENCOUNTER — Inpatient Hospital Stay (HOSPITAL_COMMUNITY)
Admission: AD | Admit: 2022-06-27 | Discharge: 2022-06-27 | Disposition: A | Discharge status: Home / Self Care | Payer: Medicaid Other | Attending: Obstetrics and Gynecology | Admitting: Obstetrics and Gynecology

## 2022-06-27 ENCOUNTER — Encounter (HOSPITAL_COMMUNITY): Payer: Self-pay | Admitting: Obstetrics and Gynecology

## 2022-06-27 ENCOUNTER — Other Ambulatory Visit: Payer: Self-pay

## 2022-06-27 DIAGNOSIS — O9089 Other complications of the puerperium, not elsewhere classified: Secondary | ICD-10-CM | POA: Diagnosis not present

## 2022-06-27 DIAGNOSIS — R339 Retention of urine, unspecified: Secondary | ICD-10-CM | POA: Diagnosis not present

## 2022-06-27 DIAGNOSIS — R102 Pelvic and perineal pain: Secondary | ICD-10-CM | POA: Diagnosis not present

## 2022-06-27 LAB — URINALYSIS, ROUTINE W REFLEX MICROSCOPIC
Bacteria, UA: NONE SEEN
Bilirubin Urine: NEGATIVE
Glucose, UA: NEGATIVE mg/dL
Hgb urine dipstick: NEGATIVE
Ketones, ur: NEGATIVE mg/dL
Leukocytes,Ua: NEGATIVE
Nitrite: NEGATIVE
Protein, ur: NEGATIVE mg/dL
Specific Gravity, Urine: 1.026 (ref 1.005–1.030)
pH: 6 (ref 5.0–8.0)

## 2022-06-27 NOTE — MAU Note (Addendum)
...  Terri Miller is a 32 y.o. at 8 days post partum here in MAU reporting: Constant lower abdominal pain that feels like a constant cramp. She reports this pain began earlier this morning but is unable to specify a time. She reports it hurts to urinate and she has not been able to urinate in around three hours. She denies any foul smelling vaginal odors. She reports her bleeding has decreased and not much blood gets on her pad. She was not wearing a pad upon arrival. Attempted to urinate in MAU and could not. She reports yesterday she had diarrhea more times than she could count. Patient very fatigued. Speaks french. Denies needing interpreter.  RN assessment: 1st bladder scan: 195 mL 2nd bladder scan: 205 mL Tender to palpation. Pressure applied makes her feel as if she needs to urinate.  Onset of complaint: Earlier this morning Pain score: 10/10 lower abdomen   Lab orders placed from triage:  UA

## 2022-06-27 NOTE — MAU Provider Note (Signed)
History     [id number]  Arrival date and time: 06/27/22 1129    Chief Complaint  Patient presents with   Abdominal Pain     HPI Terri Miller is a 32 y.o. [id number] s/p uncomplicated NSVD on 1/60/7371, who presents for abdominal pain.   Patient had uncomplicated NSVD on 0/62/6948 and was discharged home the following day Reports that this morning she awoke and had intense lower abdominal/suprapubic pain She feels a strong urge to urinate but nothing comes out Denies fever Vaginal bleeding has been typical for her postpartum periods, not excessive No back pain Has not experienced something similar in the past   --/--/O POS (01/23 0050)  OB History     Gravida  6   Para  4   Term  4   Preterm  0   AB  2   Living  4      SAB  2   IAB  0   Ectopic  0   Multiple  0   Live Births  4           Past Medical History:  Diagnosis Date   Medical history non-contributory     Past Surgical History:  Procedure Laterality Date   NEXPLANON TRAY  06/20/2022   NO PAST SURGERIES      Family History  Problem Relation Age of Onset   Healthy Mother    Healthy Father     Social History   Socioeconomic History   Marital status: Married    Spouse name: Ousseini   Number of children: Not on file   Years of education: Not on file   Highest education level: Not on file  Occupational History   Not on file  Tobacco Use   Smoking status: Never   Smokeless tobacco: Never  Substance and Sexual Activity   Alcohol use: Yes   Drug use: Not Currently   Sexual activity: Not Currently    Birth control/protection: Implant, None  Other Topics Concern   Not on file  Social History Narrative   ** Merged History Encounter **       Social Determinants of Health   Financial Resource Strain: Low Risk  (07/07/2018)   Overall Financial Resource Strain (CARDIA)    Difficulty of Paying Living Expenses: Not hard at all  Food Insecurity: No Food  Insecurity (06/19/2022)   Hunger Vital Sign    Worried About Running Out of Food in the Last Year: Never true    Ran Out of Food in the Last Year: Never true  Transportation Needs: No Transportation Needs (06/19/2022)   PRAPARE - Hydrologist (Medical): No    Lack of Transportation (Non-Medical): No  Physical Activity: Unknown (07/07/2018)   Exercise Vital Sign    Days of Exercise per Week: Patient refused    Minutes of Exercise per Session: Patient refused  Stress: No Stress Concern Present (07/07/2018)   [place]    Feeling of Stress : Not at all  Social Connections: Unknown (07/07/2018)   Social Connection and Isolation Panel [NHANES]    Frequency of Communication with Friends and Family: Patient refused    Frequency of Social Gatherings with Friends and Family: Patient refused    Attends Religious Services: Patient refused    Active Member of Clubs or Organizations: Patient refused    Attends Archivist Meetings: Patient refused    Marital Status: Patient  refused  Intimate Partner Violence: Unknown (07/07/2018)   Humiliation, Afraid, Rape, and Kick questionnaire    Fear of Current or Ex-Partner: Patient refused    Emotionally Abused: Patient refused    Physically Abused: Patient refused    Sexually Abused: Patient refused    Allergies  Allergen Reactions   Pork-Derived Products     Pt states she is Muslim and does not eat pork    No current facility-administered medications on file prior to encounter.   Current Outpatient Medications on File Prior to Encounter  Medication Sig Dispense Refill   acetaminophen (TYLENOL) 325 MG tablet Take 2 tablets (650 mg total) by mouth every 4 (four) hours as needed (for pain scale < 4). 30 tablet 0   benzocaine-Menthol (DERMOPLAST) 20-0.5 % AERO Apply 1 Application topically as needed for irritation (perineal discomfort). 56 g 0   Blood  Pressure Monitoring (BLOOD PRESSURE KIT) DEVI 1 Device by Does not apply route as needed. 1 each 0   ibuprofen (ADVIL) 600 MG tablet Take 1 tablet (600 mg total) by mouth every 6 (six) hours. 30 tablet 0   Misc. Devices (GOJJI WEIGHT SCALE) MISC 1 Device by Does not apply route as needed. 1 each 0   Prenatal Vit-Fe Fumarate-FA (PRENATAL MULTIVITAMIN) TABS tablet Take 1 tablet by mouth daily at 12 noon. 60 tablet 0   Prenatal Vit-Fe Phos-FA-Omega (VITAFOL GUMMIES) 3.33-0.333-34.8 MG CHEW Chew 1 tablet by mouth daily. 90 tablet 5   senna-docusate (SENOKOT-S) 8.6-50 MG tablet Take 2 tablets by mouth daily. 30 tablet 0   simethicone (MYLICON) 80 MG chewable tablet Chew 1 tablet (80 mg total) by mouth as needed for flatulence. 30 tablet 0     ROS Pertinent positives and negative per HPI, all others reviewed and negative  Physical Exam   BP 110/70 (BP Location: Left Arm)   Pulse (!) 102   Temp 98.3 F (36.8 C) (Oral)   Resp 15   Ht 5' (1.524 m)   SpO2 100%   Breastfeeding Yes   BMI 29.88 kg/m   Patient Vitals for the past 24 hrs:  BP Temp Temp src Pulse Resp SpO2 Height  06/27/22 1147 110/70 98.3 F (36.8 C) Oral (!) 102 15 100 % 5' (1.524 m)    Physical Exam Vitals reviewed.  Constitutional:      General: She is not in acute distress.    Appearance: She is well-developed. She is not diaphoretic.  Eyes:     General: No scleral icterus. Pulmonary:     Effort: Pulmonary effort is normal. No respiratory distress.  Abdominal:     Palpations: Abdomen is soft.     Tenderness: There is generalized abdominal tenderness and tenderness in the suprapubic area. There is guarding. There is no rebound.     Comments: Very mild distension, some guarding in lower quadrants. Uterus firm just below umbilicus, no rebound  Skin:    General: Skin is warm and dry.  Neurological:     Mental Status: She is alert.     Coordination: Coordination normal.      Cervical Exam    Bedside  Ultrasound Pt informed that the ultrasound is considered a limited ultrasound and is not intended to be a complete ultrasound exam.  Purpose of exam to examine the bladder.  Patient acknowledges the purpose of the exam and the limitations of the study.     My interpretation: distended bladder. Uterus without significant endometrial thickening, no significant fluid seen posteriorly. Normal R  kidney, no fluid seen in Morrison's pouch.    Labs No results found for this or any previous visit (from the past 24 hour(s)).  Imaging No results found.  MAU Course  Procedures Lab Orders         Urine Culture         Urinalysis, Routine w reflex microscopic -Urine, Clean Catch     No orders of the defined types were placed in this encounter.  Imaging Orders  No imaging studies ordered today    MDM moderate  Assessment and Plan  #Postpartum urinary retention Unclear etiology, significant improvement with pain after placement of foley balloon with drainage of 450 mL of urine. Ddx idiopathic vs related to uterine positioning/postpartum changes vs infection. No obvious offending meds. Will send Ucx to evaluate for infection but low suspicion. Discussed removing foley vs keeping it in for one week to decompress bladder and hopefully prevent recurrence, they elected for the latter plan. Patient and husband instructed in how to empty the bag. Message sent to schedule 1 wk follow up at [facility].    Dispo: discharged to home in stable condition.    Clarnce Flock, MD/MPH 06/27/22 1:04 PM  Allergies as of 06/27/2022       Reactions   Pork-derived Products    Pt states she is Muslim and does not eat pork        Medication List     TAKE these medications    acetaminophen 325 MG tablet Commonly known as: Tylenol Take 2 tablets (650 mg total) by mouth every 4 (four) hours as needed (for pain scale < 4).   benzocaine-Menthol 20-0.5 % Aero Commonly known as: DERMOPLAST Apply 1  Application topically as needed for irritation (perineal discomfort).   Blood Pressure Kit Devi 1 Device by Does not apply route as needed.   Gojji Weight Scale Misc 1 Device by Does not apply route as needed.   ibuprofen 600 MG tablet Commonly known as: ADVIL Take 1 tablet (600 mg total) by mouth every 6 (six) hours.   Prenatal 27-1 MG Tabs Take 1 tablet by mouth daily at 12 noon.   Senexon-S 8.6-50 MG tablet Generic drug: senna-docusate Take 2 tablets by mouth daily.   simethicone 80 MG chewable tablet Commonly known as: MYLICON Chew 1 tablet (80 mg total) by mouth as needed for flatulence.   Vitafol Gummies 3.33-0.333-34.8 MG Chew Chew 1 tablet by mouth daily.

## 2022-06-28 LAB — URINE CULTURE: Culture: NO GROWTH

## 2022-07-04 ENCOUNTER — Other Ambulatory Visit: Payer: Self-pay

## 2022-07-04 ENCOUNTER — Ambulatory Visit (INDEPENDENT_AMBULATORY_CARE_PROVIDER_SITE_OTHER): Payer: Medicaid Other | Admitting: Family Medicine

## 2022-07-04 VITALS — BP 117/69 | HR 79 | Ht 64.0 in | Wt 149.9 lb

## 2022-07-04 DIAGNOSIS — R339 Retention of urine, unspecified: Secondary | ICD-10-CM | POA: Diagnosis not present

## 2022-07-04 NOTE — Progress Notes (Signed)
   GYNECOLOGY PROBLEM  VISIT ENCOUNTER NOTE  Subjective:   Terri Miller is a 32 y.o. [phone] female here for a problem GYN visit.  Current complaints: here for foley removal with trial void.   Denies abnormal vaginal bleeding, discharge, pelvic pain, problems with intercourse or other gynecologic concerns.    Gynecologic History No LMP recorded.  Contraception: none  Health Maintenance Due  Topic Date Due   COVID-19 Vaccine (1) Never done   PAP SMEAR-Modifier  08/11/2021    The following portions of the patient's history were reviewed and updated as appropriate: allergies, current medications, past family history, past medical history, past social history, past surgical history and problem list.  Review of Systems Pertinent items are noted in HPI.   Objective:  BP 117/69   Pulse 79   Ht 5\' 4"  (1.626 m)   Wt 149 lb 14.4 oz (68 kg)   BMI 25.73 kg/m  Gen: well appearing, NAD HEENT: no scleral icterus CV: RR Lung: Normal WOB Ext: warm well perfused  Bedside US after voiding-- Limited US - No urine in the bladder, completely flattened bladder  - able to visualize the uterus which is normal appearing for postpartum.    Assessment and Plan:  1. Urinary retention Resolved Bladder scan was negative   Please refer to After Visit Summary for other counseling recommendations.   No follow-ups on file.  Caren Macadam, MD, MPH, ABFM Attending [place] for [facility]

## 2022-07-04 NOTE — Progress Notes (Signed)
Pt is here today for removal of foley cath.  Pt tolerated well.  Reviewed chart with Dione Plover, MD who recommends that pt stay in the office with water, provided with specimen hat to collect urine, and requested a provider to scan abdomen for urine retention.  Obtained 150 cc of urine.  Dr. Ernestina Patches at bedside with ultrasound.    Terri Miller  07/04/22

## 2022-07-23 ENCOUNTER — Ambulatory Visit (INDEPENDENT_AMBULATORY_CARE_PROVIDER_SITE_OTHER): Payer: Medicaid Other | Admitting: Obstetrics and Gynecology

## 2022-07-23 ENCOUNTER — Other Ambulatory Visit: Payer: Self-pay

## 2022-07-23 ENCOUNTER — Encounter: Payer: Self-pay | Admitting: Obstetrics and Gynecology

## 2022-07-23 ENCOUNTER — Other Ambulatory Visit (HOSPITAL_COMMUNITY)
Admission: RE | Admit: 2022-07-23 | Discharge: 2022-07-23 | Disposition: A | Discharge status: Home / Self Care | Payer: Medicaid Other | Source: Ambulatory Visit | Attending: Obstetrics and Gynecology | Admitting: Obstetrics and Gynecology

## 2022-07-23 DIAGNOSIS — Z124 Encounter for screening for malignant neoplasm of cervix: Secondary | ICD-10-CM

## 2022-07-23 NOTE — Progress Notes (Signed)
[facility] Partum Visit Note  Terri Miller is a 32 y.o. [phone] female who presents for a postpartum visit. She is 5 weeks postpartum following a normal spontaneous vaginal delivery.  I have fully reviewed the prenatal and intrapartum course. The delivery was at 69w4dgestational weeks.  Anesthesia: none. Postpartum course has been going well. Baby is doing well. Baby is feeding reast. Bleeding no bleeding. Bowel function is normal. Bladder function is normal. Patient is not sexually active. Contraception method is Nexplanon. Postpartum depression screening: negative.   The pregnancy intention screening data noted above was reviewed. Potential methods of contraception were discussed. The patient elected to proceed with No data recorded.   Edinburgh Postnatal Depression Scale - 07/23/22 0839       Edinburgh Postnatal Depression Scale:  In the Past 7 Days   I have been able to laugh and see the funny side of things. 0    I have looked forward with enjoyment to things. 0    I have blamed myself unnecessarily when things went wrong. 0    I have been anxious or worried for no good reason. 0    I have felt scared or panicky for no good reason. 0    Things have been getting on top of me. 0    I have been so unhappy that I have had difficulty sleeping. 0    I have felt sad or miserable. 0    I have been so unhappy that I have been crying. 0    The thought of harming myself has occurred to me. 0    Edinburgh Postnatal Depression Scale Total 0             Health Maintenance Due  Topic Date Due   COVID-19 Vaccine (1) Never done   PAP SMEAR-Modifier  08/11/2021    The following portions of the patient's history were reviewed and updated as appropriate: allergies, current medications, past family history, past medical history, past social history, past surgical history, and problem list.  Review of Systems Pertinent items are noted in HPI.  Objective:  BP 112/73   Pulse  (!) 113    General:  alert, cooperative, and no distress  Lungs: Normal effort  Heart:  Normal rate  Abdomen: soft, non-tender; bowel sounds normal; no masses,  no organomegaly   GU exam:  normal       Assessment:    1. Postpartum care and examination - pap with HPV done   5-6 wk postpartum exam.   Plan:   Essential components of care per ACOG recommendations:  1.  Mood and well being: Patient with negative depression screening today. Reviewed local resources for support.  - Patient tobacco use? No.   - hx of drug use? No.    2. Infant care and feeding:  -Patient currently breastmilk feeding? Yes. Reviewed importance of draining breast regularly to support lactation.  -Social determinants of health (SDOH) reviewed in EPIC. No concerns  3. Sexuality, contraception and birth spacing - Patient does not want a pregnancy in the next year.  Desired family size is 4 children at least for now.  - Reviewed reproductive life planning. Reviewed contraceptive methods based on pt preferences and effectiveness.  Patient has Nexplanon - Discussed birth spacing of 18 months  4. Sleep and fatigue -Encouraged family/partner/community support of 4 hrs of uninterrupted sleep to help with mood and fatigue  5. Physical Recovery  - Discussed patients delivery and complications. She  describes her labor as good. - Patient had a Vaginal, no problems at delivery. Patient had a 1st degree laceration. Perineal healing reviewed. Patient expressed understanding - Patient has urinary incontinence? No. - Patient is safe to resume physical and sexual activity  6.  Health Maintenance - HM due items addressed Yes - Last pap smear  Diagnosis  Date Value Ref Range Status  08/12/2018   Final   NEGATIVE FOR INTRAEPITHELIAL LESIONS OR MALIGNANCY.   Pap smear done at today's visit.  -Breast Cancer screening indicated? No.   7. Chronic Disease/Pregnancy Condition follow up: None  - PCP follow up  Radene Gunning, [place] [facility], [place]

## 2022-07-25 LAB — CYTOLOGY - PAP
Comment: NEGATIVE
Diagnosis: UNDETERMINED — AB
High risk HPV: NEGATIVE

## 2022-07-26 ENCOUNTER — Telehealth: Payer: Self-pay | Admitting: *Deleted

## 2022-07-26 NOTE — Telephone Encounter (Addendum)
-----   Message from Radene Gunning, MD sent at 07/26/2022  7:50 AM EST ----- Please let her know pap is ascus/hpv negative - this means overall normal pap smear and we would recheck when she is due. This is most likely due to her inflammation from being recently delivered.   Thanks! Pad  2/29  1600 Called pt w/[employer] interpreter [phone] and she did not answer. Repeat attempt to reach pt will be made at another time. She will need repeat Pap in 3 years.

## 2022-08-02 NOTE — Telephone Encounter (Signed)
Called patient with assistance of [place] telephone [employer], [phone]. Patient did not answer. LM for patient to call the office at [phone] for non urgent lab results. Will send letter to patient today.

## 2022-08-29 ENCOUNTER — Ambulatory Visit (HOSPITAL_COMMUNITY): Payer: Self-pay

## 2022-08-29 NOTE — Lactation Note (Signed)
This note was copied from a baby's chart. Lactation Consultation Note  Patient Name: Terri Miller [id number] Date: 08/29/2022 Age:30 m.o.  LC attempted to see Birth Parent and infant. Per RN, Birth Parent is asleep currently. [place] services will follow up in morning. Current plan is to feed infant every 3 hours limit feeds to 30 minutes. Goal is to increase volume 90 mls per feeding from EBM /formula. See  RD and SLP feeding plan.   Maternal Data    Feeding Nipple Type: Dr. Roosvelt Harps level 1  LATCH Score                    Lactation Tools Discussed/Used    Interventions    Discharge    Consult Status      Eulis Canner 08/29/2022, 11:56 PM

## 2022-08-30 ENCOUNTER — Ambulatory Visit (HOSPITAL_COMMUNITY): Payer: Self-pay

## 2022-08-30 NOTE — Lactation Note (Signed)
This note was copied from a baby's chart. Lactation Consultation Note  Patient Name: Terri Miller Terri Miller Date: 08/30/2022 Age:32 Miller.o.  Reason for consult: Initial assessment;MD order;Other (Comment);Infant weight loss  MD referral made for lactation to provide support to mother with breastfeeding due to infant's hospitalization, concern of mother's milk supply decreasing due to hospitalization and facilitate mother obtaining a DEBP for home use. Marland Kitchen  Upon entry to room, "Terri Miller" was being fed a bottle with formula by his mother. Mother reports that she breastfeeds when he is awake but does not wake him to breast feed. If baby is sleeping, she will pump, expressing approx 90 ml. She reports pumping 3-4 times per day. When needed, baby is fed formula.  Baby has been in the hospital since 08/18/2022 due to respiratory distress and extubated on 08/24/2022. During that time and up to yesterday 08/29/2022, mother has been using a hand pump to express milk. Baby remains hospitalized currently due to sedation weaning and weight gain. Terri Miller would like mother to receive assistance with obtaining a DEBP for home use to help maintain and reestablish (if needed) mother's milk supply.    Referral has been submitted to [facility] to see if mother qualifies for a pump and if it can be obtained after mother has been discharged. Referral has also been sent to [facility] in [facility] for a pump request. LC will follow up with both sources.  Baby is having slow weight gain and has mostly been breastfeeding with 3 or more bottles of 90 ml added per day. A weighted feed, if it can be arranged with mother, would provide information on milk transfer. Lactation to follow up with baby's nurse to coordinate a time to assess a weighted feeding.  Terri Miller was updated with plan and agreeable.   Maternal Data Does the patient have breastfeeding experience prior to this delivery?: Yes How long did the patient  breastfeed?: 4 months to 18 months  Feeding Mother's Current Feeding Choice: Breast Milk and Formula Nipple Type: Dr. Roosvelt Harps level 1  LATCH Score  Did not observe breastfeeding/ latch    Lactation Tools Discussed/Used Tools: Pump Breast pump type: Double-Electric Breast Pump Pump Education: Setup, frequency, and cleaning;Milk Storage Reason for Pumping: supplement breastfeeding for infant weight gain Pumping frequency: 3-4 times per day Pumped volume: 90 mL  Interventions Interventions: Education  Discharge Pump: WIC Pump WIC Program: Yes  Consult Status Consult Status: Follow-up Date: 08/31/22 Follow-up type: In-patient    Terri Miller 08/30/2022, 5:56 PM
